# Patient Record
Sex: Male | Born: 1935 | Race: Black or African American | Hispanic: No | Marital: Single
Health system: Southern US, Community
[De-identification: ages and names within clinical notes are randomized; demographics above are authoritative.]

---

## 2013-11-10 DEATH — deceased

## 2017-05-27 ENCOUNTER — Inpatient Hospital Stay
Admission: AD | Admit: 2017-05-27 | Discharge: 2017-07-11 | Disposition: E | Payer: Self-pay | Source: Other Acute Inpatient Hospital | Attending: Internal Medicine | Admitting: Internal Medicine

## 2017-05-27 ENCOUNTER — Other Ambulatory Visit (HOSPITAL_COMMUNITY): Payer: Self-pay

## 2017-05-27 DIAGNOSIS — K922 Gastrointestinal hemorrhage, unspecified: Secondary | ICD-10-CM

## 2017-05-27 DIAGNOSIS — Z4659 Encounter for fitting and adjustment of other gastrointestinal appliance and device: Secondary | ICD-10-CM

## 2017-05-27 DIAGNOSIS — J9690 Respiratory failure, unspecified, unspecified whether with hypoxia or hypercapnia: Secondary | ICD-10-CM

## 2017-05-27 DIAGNOSIS — Z978 Presence of other specified devices: Secondary | ICD-10-CM

## 2017-05-27 DIAGNOSIS — K567 Ileus, unspecified: Secondary | ICD-10-CM

## 2017-05-27 DIAGNOSIS — J189 Pneumonia, unspecified organism: Secondary | ICD-10-CM

## 2017-05-28 LAB — CBC WITH DIFFERENTIAL/PLATELET
BASOS ABS: 0 10*3/uL (ref 0.0–0.1)
BASOS PCT: 0 %
EOS ABS: 0.1 10*3/uL (ref 0.0–0.7)
EOS PCT: 1 %
HCT: 33.3 % — ABNORMAL LOW (ref 39.0–52.0)
Hemoglobin: 9.6 g/dL — ABNORMAL LOW (ref 13.0–17.0)
Lymphocytes Relative: 9 %
Lymphs Abs: 0.7 10*3/uL (ref 0.7–4.0)
MCH: 30.1 pg (ref 26.0–34.0)
MCHC: 28.8 g/dL — AB (ref 30.0–36.0)
MCV: 104.4 fL — ABNORMAL HIGH (ref 78.0–100.0)
MONO ABS: 0.5 10*3/uL (ref 0.1–1.0)
Monocytes Relative: 6 %
Neutro Abs: 6.4 10*3/uL (ref 1.7–7.7)
Neutrophils Relative %: 84 %
PLATELETS: 126 10*3/uL — AB (ref 150–400)
RBC: 3.19 MIL/uL — ABNORMAL LOW (ref 4.22–5.81)
RDW: 18.5 % — AB (ref 11.5–15.5)
WBC: 7.7 10*3/uL (ref 4.0–10.5)

## 2017-05-28 LAB — CBC
HEMATOCRIT: 35.5 % — AB (ref 39.0–52.0)
HEMOGLOBIN: 10.3 g/dL — AB (ref 13.0–17.0)
MCH: 30.5 pg (ref 26.0–34.0)
MCHC: 29 g/dL — ABNORMAL LOW (ref 30.0–36.0)
MCV: 105 fL — ABNORMAL HIGH (ref 78.0–100.0)
Platelets: 137 10*3/uL — ABNORMAL LOW (ref 150–400)
RBC: 3.38 MIL/uL — ABNORMAL LOW (ref 4.22–5.81)
RDW: 18.8 % — ABNORMAL HIGH (ref 11.5–15.5)
WBC: 8.5 10*3/uL (ref 4.0–10.5)

## 2017-05-28 LAB — COMPREHENSIVE METABOLIC PANEL
ALBUMIN: 3.7 g/dL (ref 3.5–5.0)
ALT: 10 U/L — ABNORMAL LOW (ref 17–63)
ANION GAP: 11 (ref 5–15)
AST: 15 U/L (ref 15–41)
Alkaline Phosphatase: 142 U/L — ABNORMAL HIGH (ref 38–126)
BUN: 29 mg/dL — AB (ref 6–20)
CHLORIDE: 99 mmol/L — AB (ref 101–111)
CO2: 27 mmol/L (ref 22–32)
Calcium: 9.2 mg/dL (ref 8.9–10.3)
Creatinine, Ser: 4.28 mg/dL — ABNORMAL HIGH (ref 0.61–1.24)
GFR calc Af Amer: 14 mL/min — ABNORMAL LOW (ref >60)
GFR calc non Af Amer: 12 mL/min — ABNORMAL LOW (ref >60)
GLUCOSE: 106 mg/dL — AB (ref 65–99)
POTASSIUM: 3.9 mmol/L (ref 3.5–5.1)
Sodium: 137 mmol/L (ref 135–145)
Total Bilirubin: 0.8 mg/dL (ref 0.3–1.2)
Total Protein: 6.2 g/dL — ABNORMAL LOW (ref 6.5–8.1)

## 2017-05-28 LAB — RENAL FUNCTION PANEL
ALBUMIN: 3.9 g/dL (ref 3.5–5.0)
ANION GAP: 9 (ref 5–15)
BUN: 34 mg/dL — ABNORMAL HIGH (ref 6–20)
CO2: 26 mmol/L (ref 22–32)
Calcium: 9.3 mg/dL (ref 8.9–10.3)
Chloride: 97 mmol/L — ABNORMAL LOW (ref 101–111)
Creatinine, Ser: 4.78 mg/dL — ABNORMAL HIGH (ref 0.61–1.24)
GFR calc Af Amer: 12 mL/min — ABNORMAL LOW (ref >60)
GFR, EST NON AFRICAN AMERICAN: 11 mL/min — AB (ref >60)
Glucose, Bld: 211 mg/dL — ABNORMAL HIGH (ref 65–99)
PHOSPHORUS: 4.7 mg/dL — AB (ref 2.5–4.6)
POTASSIUM: 4 mmol/L (ref 3.5–5.1)
Sodium: 132 mmol/L — ABNORMAL LOW (ref 135–145)

## 2017-05-28 LAB — TSH: TSH: 12.29 u[IU]/mL — AB (ref 0.350–4.500)

## 2017-05-28 LAB — MAGNESIUM: MAGNESIUM: 2.4 mg/dL (ref 1.7–2.4)

## 2017-05-28 LAB — PROTIME-INR
INR: 3.46
PROTHROMBIN TIME: 35.6 s — AB (ref 11.4–15.2)

## 2017-05-28 LAB — PHOSPHORUS: PHOSPHORUS: 4.6 mg/dL (ref 2.5–4.6)

## 2017-05-28 NOTE — Consult Note (Signed)
[facility] CONSULT NOTE    Date: 05/28/2017                  Patient Name:  David Hatfield  MRN: [id number]  DOB: Feb 02, 1936  Age / Sex: 81 y.o., male         PCP: Patient, No Pcp Per                 Service Requesting Consult: hospitalist                 Reason for Consult: Management of end-stage renal disease            History of Present Illness: Patient is a 81 y.o. male with a PMHx of end-stage renal disease, atrial fibrillation, aortic stenosis status post St. Jude valve replacement on anticoagulation, hypertension, anemia of chronic kidney disease, secondary hyperparathyroidism, chronic hypoxic respiratory failure, coronary artery disease status post CABG, history of mitral valve replacement who [facility].  He was initially found to have a right internal jugular thrombus for which she was started on anticoagulation. He also had septic shock while at the outside hospital for possible aspiration pneumonia. He was transitioned here for ongoing care. He is a bit lethargic at the moment and cannot offer significant history as to why he is here. Apparently the patient has been on doses for approximately 5 years. Patient has history of hypertension which is made ultrafiltration difficult.   Medications: Coumadin Midodrine 10 mg daily, metoprolol 25 mg daily, Pepcid 20 mg twice a day, calcium acetate 2 tablets by mouth 3 times a day, Lipitor 40 mg daily  Allergies: penicillin and codeine   Past Medical History: end-stage renal disease, atrial fibrillation, aortic stenosis status post St. Jude valve replacement on anticoagulation, hypertension, anemia of chronic kidney disease, secondary hyperparathyroidism, chronic hypoxic respiratory failure, coronary artery disease status post CABG, history of mitral valve replacement who [facility]  generalized debility.    Past Surgical History: Aortic valve replacement CABG Mitral valve replacement Cataract removal  Family History: Includes coronary artery disease and hypertension  Social History: Patient apparently lives at home alone. He was independent of activities of daily living prior to admission. He quit tobacco use in 1997. Alcoholism drug use.   Review of Systems: Patient unable to focus on review of systems questions.  Vital Signs: Temperature 96.5 pulse 85 respirations 17 blood pressure 106/60 Weight trends: There were no vitals filed for this visit.  Physical Exam: General: Sitting up in chair, no acute distress  Head: Normocephalic, atraumatic.  Eyes: Anicteric, EOMI  Nose: Mucous membranes moist, not inflammed, nonerythematous.  Throat: Oropharynx nonerythematous, no exudate appreciated.   Neck: Supple, trachea midline.  Lungs:  Normal respiratory effort.  Scattered rhonchi  Heart: S1S2 irregular 2/6 SEM  Abdomen:  BS normoactive. Soft, Nondistended, non-tender.  No masses or organomegaly.  Extremities: Trace b/l LE edema  Neurologic: Awake, confused, will follow simple commands  Skin: No visible rashes, scars.    Lab results: Basic Metabolic Panel:  Recent Labs Lab 05/28/17 0819  NA 137  K 3.9  CL 99*  CO2 27  GLUCOSE 106*  BUN 29*  CREATININE 4.28*  CALCIUM 9.2  MG 2.4  PHOS 4.6    Liver Function Tests:  Recent Labs Lab 05/28/17 0819  AST 15  ALT 10*  ALKPHOS 142*  BILITOT 0.8  PROT 6.2*  ALBUMIN 3.7   No results for input(s): LIPASE, AMYLASE in the last 168 hours. No results for input(s): AMMONIA in the last 168 hours.  CBC:  Recent Labs Lab 05/28/17 0819  WBC 7.7  NEUTROABS 6.4  HGB 9.6*  HCT 33.3*  MCV 104.4*  PLT 126*    Cardiac Enzymes: No results for input(s): CKTOTAL, CKMB, CKMBINDEX, TROPONINI in the last 168 hours.  BNP: Invalid input(s): POCBNP  CBG: No results for input(s): GLUCAP in the last  168 hours.  Microbiology: No results found for this or any previous visit.  Coagulation Studies:  Recent Labs  05/28/17 0819  LABPROT 35.6*  INR 3.46    Urinalysis: No results for input(s): COLORURINE, LABSPEC, PHURINE, GLUCOSEU, HGBUR, BILIRUBINUR, KETONESUR, PROTEINUR, UROBILINOGEN, NITRITE, LEUKOCYTESUR in the last 72 hours.  Invalid input(s): APPERANCEUR    Imaging: Dg Chest Port 1 View  Result Date: 05/14/2017 CLINICAL DATA:  Respiratory failure.  Pneumonia. EXAM: PORTABLE CHEST 1 VIEW COMPARISON:  None. FINDINGS: Very low lung volumes limit assessment. Left-sided pacemaker with leads projecting over the right atrium and ventricle. Patient is post median sternotomy with prosthetic valve. The heart is enlarged. Ill-defined patchy opacity in the left mid lower lung zone. Elevated right hemidiaphragm versus lower lobe atelectasis/collapse. Bilateral pleural effusions. Peribronchial thickening in the aerated lungs. No evidence of acute osseous abnormality. IMPRESSION: Very low lung volumes. Cardiomegaly with bilateral pleural effusions and peribronchial thickening, suggesting pulmonary edema. Suspect an element of congestive failure. Patchy opacity in the left mid lower lung zone suspicious for pneumonia, however compressive atelectasis in the setting of pleural effusion could have a similar appearance. Elevated right hemidiaphragm versus lower lobe atelectasis/collapse. No prior exams for comparison. Electronically Signed   By: Rubye OaksMelanie  Ehinger M.D.   On: 06/05/2017 22:04      Assessment & Plan: Pt is a 81 y.o. male with a PMHx of end-stage renal disease, atrial fibrillation, aortic stenosis status post St. Jude valve replacement on anticoagulation, hypertension, anemia of chronic kidney disease, secondary hyperparathyroidism, chronic hypoxic respiratory failure, coronary artery disease status post CABG, history of mitral valve replacement who [facility] debility.    1. ESRD on HD. We will plan for dialysis starting tomorrow. Time will be 3.5 hours, blood flow rate 400, dialysis flow rate 800, ultrafiltration target 1.5 kg. We will need to monitor the patient closely as he said difficulties with ultrafiltration previously. We may need to consider albumin support as well.  2. Hypertension. Chronic in nature. Continue the patient on midodrine. Consider use of albumin as above.  3.  Anemia of chronic kidney disease. Hemoglobin currently 9.6. Continue to monitor during the course of hospitalization.  4. Secondary hyperparathyroidism. Continue calcium acetate for phosphorus control. Check PTH and phosphorus tomorrow.  5. Hyponatremia. Should partially correct with hemodialysis.

## 2017-05-29 LAB — CBC WITH DIFFERENTIAL/PLATELET
Basophils Absolute: 0 10*3/uL (ref 0.0–0.1)
Basophils Relative: 1 %
EOS ABS: 0.1 10*3/uL (ref 0.0–0.7)
Eosinophils Relative: 2 %
HEMATOCRIT: 32.5 % — AB (ref 39.0–52.0)
Hemoglobin: 9.6 g/dL — ABNORMAL LOW (ref 13.0–17.0)
LYMPHS ABS: 0.6 10*3/uL — AB (ref 0.7–4.0)
Lymphocytes Relative: 9 %
MCH: 30.4 pg (ref 26.0–34.0)
MCHC: 29.5 g/dL — AB (ref 30.0–36.0)
MCV: 102.8 fL — ABNORMAL HIGH (ref 78.0–100.0)
MONOS PCT: 8 %
Monocytes Absolute: 0.5 10*3/uL (ref 0.1–1.0)
NEUTROS ABS: 5.3 10*3/uL (ref 1.7–7.7)
NEUTROS PCT: 82 %
Platelets: 107 10*3/uL — ABNORMAL LOW (ref 150–400)
RBC: 3.16 MIL/uL — AB (ref 4.22–5.81)
RDW: 18.2 % — ABNORMAL HIGH (ref 11.5–15.5)
WBC: 6.6 10*3/uL (ref 4.0–10.5)

## 2017-05-29 LAB — RENAL FUNCTION PANEL
ALBUMIN: 3.5 g/dL (ref 3.5–5.0)
Albumin: 3.6 g/dL (ref 3.5–5.0)
Anion gap: 9 (ref 5–15)
Anion gap: 9 (ref 5–15)
BUN: 18 mg/dL (ref 6–20)
BUN: 19 mg/dL (ref 6–20)
CALCIUM: 8.8 mg/dL — AB (ref 8.9–10.3)
CALCIUM: 8.9 mg/dL (ref 8.9–10.3)
CHLORIDE: 99 mmol/L — AB (ref 101–111)
CO2: 29 mmol/L (ref 22–32)
CO2: 28 mmol/L (ref 22–32)
Chloride: 100 mmol/L — ABNORMAL LOW (ref 101–111)
Creatinine, Ser: 3.04 mg/dL — ABNORMAL HIGH (ref 0.61–1.24)
Creatinine, Ser: 3.02 mg/dL — ABNORMAL HIGH (ref 0.61–1.24)
GFR calc Af Amer: 21 mL/min — ABNORMAL LOW (ref >60)
GFR calc Af Amer: 21 mL/min — ABNORMAL LOW (ref >60)
GFR calc non Af Amer: 18 mL/min — ABNORMAL LOW (ref >60)
GFR, EST NON AFRICAN AMERICAN: 18 mL/min — AB (ref >60)
GLUCOSE: 101 mg/dL — AB (ref 65–99)
GLUCOSE: 104 mg/dL — AB (ref 65–99)
PHOSPHORUS: 2.3 mg/dL — AB (ref 2.5–4.6)
POTASSIUM: 3.4 mmol/L — AB (ref 3.5–5.1)
Phosphorus: 2.3 mg/dL — ABNORMAL LOW (ref 2.5–4.6)
Potassium: 3.4 mmol/L — ABNORMAL LOW (ref 3.5–5.1)
SODIUM: 136 mmol/L (ref 135–145)
SODIUM: 138 mmol/L (ref 135–145)

## 2017-05-29 LAB — MAGNESIUM: MAGNESIUM: 2 mg/dL (ref 1.7–2.4)

## 2017-05-29 LAB — PROTIME-INR
INR: 2.92
PROTHROMBIN TIME: 31.1 s — AB (ref 11.4–15.2)

## 2017-05-29 LAB — HEMOGLOBIN A1C
HEMOGLOBIN A1C: 5.1 % (ref 4.8–5.6)
Mean Plasma Glucose: 100 mg/dL

## 2017-05-30 LAB — PROTIME-INR
INR: 2.11
Prothrombin Time: 24 seconds — ABNORMAL HIGH (ref 11.4–15.2)

## 2017-05-30 LAB — CBC
HEMATOCRIT: 30.9 % — AB (ref 39.0–52.0)
Hemoglobin: 9.2 g/dL — ABNORMAL LOW (ref 13.0–17.0)
MCH: 30.8 pg (ref 26.0–34.0)
MCHC: 29.8 g/dL — AB (ref 30.0–36.0)
MCV: 103.3 fL — ABNORMAL HIGH (ref 78.0–100.0)
Platelets: 105 10*3/uL — ABNORMAL LOW (ref 150–400)
RBC: 2.99 MIL/uL — ABNORMAL LOW (ref 4.22–5.81)
RDW: 18.5 % — ABNORMAL HIGH (ref 11.5–15.5)
WBC: 5.4 10*3/uL (ref 4.0–10.5)

## 2017-05-30 LAB — RENAL FUNCTION PANEL
ALBUMIN: 3.3 g/dL — AB (ref 3.5–5.0)
ANION GAP: 11 (ref 5–15)
BUN: 32 mg/dL — ABNORMAL HIGH (ref 6–20)
CO2: 26 mmol/L (ref 22–32)
Calcium: 8.7 mg/dL — ABNORMAL LOW (ref 8.9–10.3)
Chloride: 98 mmol/L — ABNORMAL LOW (ref 101–111)
Creatinine, Ser: 4.06 mg/dL — ABNORMAL HIGH (ref 0.61–1.24)
GFR calc non Af Amer: 13 mL/min — ABNORMAL LOW (ref >60)
GFR, EST AFRICAN AMERICAN: 15 mL/min — AB (ref >60)
GLUCOSE: 85 mg/dL (ref 65–99)
PHOSPHORUS: 3 mg/dL (ref 2.5–4.6)
POTASSIUM: 3.8 mmol/L (ref 3.5–5.1)
Sodium: 135 mmol/L (ref 135–145)

## 2017-05-30 LAB — PTH, INTACT AND CALCIUM
CALCIUM TOTAL (PTH): 8.7 mg/dL (ref 8.6–10.2)
PTH: 33 pg/mL (ref 15–65)

## 2017-05-30 LAB — HEPATITIS B SURFACE ANTIBODY,QUALITATIVE
HEP B S AB: NONREACTIVE
Hep B S Ab: NONREACTIVE

## 2017-05-30 LAB — HEPATITIS B CORE ANTIBODY, TOTAL
HEP B C TOTAL AB: NEGATIVE
HEP B C TOTAL AB: NEGATIVE

## 2017-05-30 LAB — HEPATITIS B SURFACE ANTIGEN
Hepatitis B Surface Ag: NEGATIVE
Hepatitis B Surface Ag: NEGATIVE

## 2017-05-30 NOTE — Progress Notes (Signed)
[facility]  ROUNDING NOTE   Subjective:  Patient seen and evaluated at bedside. He did complete hemodialysis today. Ultrafiltration and she was only 575 cc due to low blood pressure.    Objective:  Vital signs in last 24 hours:  Temperature 96.2 pulse 72 respirations 20 blood pressure 99/59  Physical Exam: General: No acute distress  Head: Normocephalic, atraumatic. Moist oral mucosal membranes  Eyes: Anicteric  Neck: Supple, trachea midline  Lungs:  Clear to auscultation, normal effort  Heart: S1S2 paced rhythm  Abdomen:  Soft, nontender, bowel sounds present  Extremities: Trace peripheral edema.  Neurologic: Lethargic but arousable  Skin: No lesions  Access: RUE AVF    Basic Metabolic Panel:  Recent Labs Lab 05/28/17 0819 05/28/17 1930 05/29/17 0726 05/30/17 0523  NA 137 132* 136  138 135  K 3.9 4.0 3.4*  3.4* 3.8  CL 99* 97* 99*  100* 98*  CO2 27 26 28  29 26   GLUCOSE 106* 211* 101*  104* 85  BUN 29* 34* 19  18 32*  CREATININE 4.28* 4.78* 3.02*  3.04* 4.06*  CALCIUM 9.2 9.3 8.8*  8.9  8.7 8.7*  MG 2.4  --  2.0  --   PHOS 4.6 4.7* 2.3*  2.3* 3.0    Liver Function Tests:  Recent Labs Lab 05/28/17 0819 05/28/17 1930 05/29/17 0726 05/30/17 0523  AST 15  --   --   --   ALT 10*  --   --   --   ALKPHOS 142*  --   --   --   BILITOT 0.8  --   --   --   PROT 6.2*  --   --   --   ALBUMIN 3.7 3.9 3.6  3.5 3.3*   No results for input(s): LIPASE, AMYLASE in the last 168 hours. No results for input(s): AMMONIA in the last 168 hours.  CBC:  Recent Labs Lab 05/28/17 0819 05/28/17 1930 05/29/17 0726 05/30/17 0523  WBC 7.7 8.5 6.6 5.4  NEUTROABS 6.4  --  5.3  --   HGB 9.6* 10.3* 9.6* 9.2*  HCT 33.3* 35.5* 32.5* 30.9*  MCV 104.4* 105.0* 102.8* 103.3*  PLT 126* 137* 107* 105*    Cardiac Enzymes: No results for input(s): CKTOTAL, CKMB, CKMBINDEX, TROPONINI in the last 168 hours.  BNP: Invalid input(s): POCBNP  CBG: No  results for input(s): GLUCAP in the last 168 hours.  Microbiology: No results found for this or any previous visit.  Coagulation Studies:  Recent Labs  05/28/17 0819 05/29/17 0726 05/30/17 0523  LABPROT 35.6* 31.1* 24.0*  INR 3.46 2.92 2.11    Urinalysis: No results for input(s): COLORURINE, LABSPEC, PHURINE, GLUCOSEU, HGBUR, BILIRUBINUR, KETONESUR, PROTEINUR, UROBILINOGEN, NITRITE, LEUKOCYTESUR in the last 72 hours.  Invalid input(s): APPERANCEUR    Imaging: No results found.   Medications:       Assessment/ Plan:  81 y.o. male with a PMHx of end-stage renal disease, atrial fibrillation, aortic stenosis status post St. Jude valve replacement on anticoagulation, hypertension, anemia of chronic kidney disease, secondary hyperparathyroidism, chronic hypoxic respiratory failure, coronary artery disease status post CABG, history of mitral valve replacement who [facility].    1. ESRD on HD. Patient completed hemodialysis today. Ultrafiltration she was only 575 cc secondary to hypotension. We will use albumin with next ulcers treatment. He is also on midodrine.  2. Hypotension. Continue use of midodrine. We will also use albumin  with dialysis treatments.  3.  Anemia of chronic kidney disease. Hemoglobin relatively stable at 9.2. Continue to monitor.  4. Secondary hyperparathyroidism. Phosphorus 3.0. Continue calcium acetate.  PT is low at 33. Continue to monitor.  5. Hyponatremia. Serum sodium now up to 135. Continue to monitor.   LOS: 0 Lovelle Lema 6/20/20183:39 PM

## 2017-05-31 LAB — PROTIME-INR
INR: 1.67
Prothrombin Time: 19.9 seconds — ABNORMAL HIGH (ref 11.4–15.2)

## 2017-06-01 LAB — RENAL FUNCTION PANEL
ALBUMIN: 3.4 g/dL — AB (ref 3.5–5.0)
Anion gap: 9 (ref 5–15)
BUN: 34 mg/dL — AB (ref 6–20)
CALCIUM: 8.5 mg/dL — AB (ref 8.9–10.3)
CO2: 26 mmol/L (ref 22–32)
Chloride: 98 mmol/L — ABNORMAL LOW (ref 101–111)
Creatinine, Ser: 4.36 mg/dL — ABNORMAL HIGH (ref 0.61–1.24)
GFR calc Af Amer: 14 mL/min — ABNORMAL LOW (ref >60)
GFR calc non Af Amer: 12 mL/min — ABNORMAL LOW (ref >60)
GLUCOSE: 89 mg/dL (ref 65–99)
PHOSPHORUS: 3.2 mg/dL (ref 2.5–4.6)
Potassium: 4.4 mmol/L (ref 3.5–5.1)
SODIUM: 133 mmol/L — AB (ref 135–145)

## 2017-06-01 LAB — PROTIME-INR
INR: 1.26
Prothrombin Time: 15.9 seconds — ABNORMAL HIGH (ref 11.4–15.2)

## 2017-06-01 NOTE — Progress Notes (Signed)
[facility]  ROUNDING NOTE   Subjective:  Patient remains lethargic but he is arousable. He is aware of where he is but cannot answer many questions. He is due for hemodialysis today.  Objective:  Vital signs in last 24 hours:  Temperature 97.6 pulse 72 respirations 30 blood pressure 108/58  Physical Exam: General: No acute distress  Head: Normocephalic, atraumatic. Moist oral mucosal membranes  Eyes: Anicteric  Neck: Supple, trachea midline  Lungs:  Clear to auscultation, normal effort  Heart: S1S2 paced rhythm  Abdomen:  Soft, nontender, bowel sounds present  Extremities: Trace peripheral edema.  Neurologic: Lethargic but arousable  Skin: No lesions  Access: RUE AVF    Basic Metabolic Panel:  Recent Labs Lab 05/28/17 0819 05/28/17 1930 05/29/17 0726 05/30/17 0523  NA 137 132* 136  138 135  K 3.9 4.0 3.4*  3.4* 3.8  CL 99* 97* 99*  100* 98*  CO2 27 26 28  29 26   GLUCOSE 106* 211* 101*  104* 85  BUN 29* 34* 19  18 32*  CREATININE 4.28* 4.78* 3.02*  3.04* 4.06*  CALCIUM 9.2 9.3 8.8*  8.9  8.7 8.7*  MG 2.4  --  2.0  --   PHOS 4.6 4.7* 2.3*  2.3* 3.0    Liver Function Tests:  Recent Labs Lab 05/28/17 0819 05/28/17 1930 05/29/17 0726 05/30/17 0523  AST 15  --   --   --   ALT 10*  --   --   --   ALKPHOS 142*  --   --   --   BILITOT 0.8  --   --   --   PROT 6.2*  --   --   --   ALBUMIN 3.7 3.9 3.6  3.5 3.3*   No results for input(s): LIPASE, AMYLASE in the last 168 hours. No results for input(s): AMMONIA in the last 168 hours.  CBC:  Recent Labs Lab 05/28/17 0819 05/28/17 1930 05/29/17 0726 05/30/17 0523  WBC 7.7 8.5 6.6 5.4  NEUTROABS 6.4  --  5.3  --   HGB 9.6* 10.3* 9.6* 9.2*  HCT 33.3* 35.5* 32.5* 30.9*  MCV 104.4* 105.0* 102.8* 103.3*  PLT 126* 137* 107* 105*    Cardiac Enzymes: No results for input(s): CKTOTAL, CKMB, CKMBINDEX, TROPONINI in the last 168 hours.  BNP: Invalid input(s): POCBNP  CBG: No  results for input(s): GLUCAP in the last 168 hours.  Microbiology: No results found for this or any previous visit.  Coagulation Studies:  Recent Labs  05/30/17 0523 05/31/17 0547  LABPROT 24.0* 19.9*  INR 2.11 1.67    Urinalysis: No results for input(s): COLORURINE, LABSPEC, PHURINE, GLUCOSEU, HGBUR, BILIRUBINUR, KETONESUR, PROTEINUR, UROBILINOGEN, NITRITE, LEUKOCYTESUR in the last 72 hours.  Invalid input(s): APPERANCEUR    Imaging: No results found.   Medications:       Assessment/ Plan:  81 y.o. male with a PMHx of end-stage renal disease, atrial fibrillation, aortic stenosis status post St. Jude valve replacement on anticoagulation, hypertension, anemia of chronic kidney disease, secondary hyperparathyroidism, chronic hypoxic respiratory failure, coronary artery disease status post CABG, history of mitral valve replacement who [facility].    1. ESRD on HD. Patient due for hemodialysis today. Continue to use Migratine as well as albumin for blood pressure support. Ultrafiltration target 1.5 kg.  2. Hypotension. As above continue to use albumin and midodrine.  3.  Anemia of chronic kidney disease. Hemoglobin 9.2  at last check. Continue to monitor.  4. Secondary hyperparathyroidism. Last serum phosphorus was 3.0. Continue calcium acetate.  5. Hyponatremia. Most recent serum sodium had normalized. Continue to monitor.   LOS: 0 Gila Lauf 6/22/20188:24 AM

## 2017-06-02 LAB — AMMONIA: Ammonia: 52 umol/L — ABNORMAL HIGH (ref 9–35)

## 2017-06-02 LAB — COMPREHENSIVE METABOLIC PANEL
ALK PHOS: 186 U/L — AB (ref 38–126)
ALT: 17 U/L (ref 17–63)
AST: 36 U/L (ref 15–41)
Albumin: 3.7 g/dL (ref 3.5–5.0)
Anion gap: 10 (ref 5–15)
BUN: 32 mg/dL — AB (ref 6–20)
CALCIUM: 8.7 mg/dL — AB (ref 8.9–10.3)
CHLORIDE: 98 mmol/L — AB (ref 101–111)
CO2: 26 mmol/L (ref 22–32)
CREATININE: 3.63 mg/dL — AB (ref 0.61–1.24)
GFR calc Af Amer: 17 mL/min — ABNORMAL LOW (ref >60)
GFR calc non Af Amer: 15 mL/min — ABNORMAL LOW (ref >60)
Glucose, Bld: 102 mg/dL — ABNORMAL HIGH (ref 65–99)
Potassium: 4.5 mmol/L (ref 3.5–5.1)
SODIUM: 134 mmol/L — AB (ref 135–145)
Total Bilirubin: 1.1 mg/dL (ref 0.3–1.2)
Total Protein: 6.3 g/dL — ABNORMAL LOW (ref 6.5–8.1)

## 2017-06-03 LAB — HEPARIN LEVEL (UNFRACTIONATED): Heparin Unfractionated: <0.1 IU/mL — ABNORMAL LOW (ref 0.30–0.70)

## 2017-06-03 LAB — PROTIME-INR
INR: 1.17
Prothrombin Time: 15 seconds (ref 11.4–15.2)

## 2017-06-04 LAB — CBC
HCT: 32.4 % — ABNORMAL LOW (ref 39.0–52.0)
Hemoglobin: 9.5 g/dL — ABNORMAL LOW (ref 13.0–17.0)
MCH: 30.3 pg (ref 26.0–34.0)
MCHC: 29.3 g/dL — ABNORMAL LOW (ref 30.0–36.0)
MCV: 103.2 fL — AB (ref 78.0–100.0)
Platelets: 100 10*3/uL — ABNORMAL LOW (ref 150–400)
RBC: 3.14 MIL/uL — ABNORMAL LOW (ref 4.22–5.81)
RDW: 17.8 % — AB (ref 11.5–15.5)
WBC: 5.8 10*3/uL (ref 4.0–10.5)

## 2017-06-04 LAB — RENAL FUNCTION PANEL
Albumin: 3.5 g/dL (ref 3.5–5.0)
Anion gap: 10 (ref 5–15)
BUN: 60 mg/dL — AB (ref 6–20)
CHLORIDE: 97 mmol/L — AB (ref 101–111)
CO2: 25 mmol/L (ref 22–32)
CREATININE: 5.24 mg/dL — AB (ref 0.61–1.24)
Calcium: 8.8 mg/dL — ABNORMAL LOW (ref 8.9–10.3)
GFR calc Af Amer: 11 mL/min — ABNORMAL LOW (ref >60)
GFR, EST NON AFRICAN AMERICAN: 9 mL/min — AB (ref >60)
Glucose, Bld: 115 mg/dL — ABNORMAL HIGH (ref 65–99)
POTASSIUM: 5.2 mmol/L — AB (ref 3.5–5.1)
Phosphorus: 4.6 mg/dL (ref 2.5–4.6)
Sodium: 132 mmol/L — ABNORMAL LOW (ref 135–145)

## 2017-06-04 LAB — HEPARIN LEVEL (UNFRACTIONATED)
Heparin Unfractionated: <0.1 IU/mL — ABNORMAL LOW (ref 0.30–0.70)
Heparin Unfractionated: 0.54 IU/mL (ref 0.30–0.70)

## 2017-06-04 LAB — PROTIME-INR
INR: 1.32
PROTHROMBIN TIME: 16.5 s — AB (ref 11.4–15.2)

## 2017-06-04 NOTE — Progress Notes (Signed)
[facility]  ROUNDING NOTE   Subjective:  Patient  Seen during HD. toelrating fair BP is chronically low; menating ok   Objective:  Vital signs in last 24 hours:   pulse 75 respirations 26 blood pressure 85/45  Physical Exam: General: No acute distress  Head: Normocephalic, atraumatic. Moist oral mucosal membranes  Eyes: Anicteric  Neck: Supple, trachea midline  Lungs:  Clear to auscultation, normal effort, [place] O2  Heart: S1S2 paced rhythm  Abdomen:  Soft, nontender, bowel sounds present  Extremities: Trace peripheral edema.  Neurologic: Lethargic but arousable  Skin: No lesions  Access: RUE AVF    Basic Metabolic Panel:  Recent Labs Lab 05/28/17 1930 05/29/17 0726 05/30/17 0523 06/01/17 0804 06/02/17 1508 06/04/17 0459  NA 132* 136  138 135 133* 134* 132*  K 4.0 3.4*  3.4* 3.8 4.4 4.5 5.2*  CL 97* 99*  100* 98* 98* 98* 97*  CO2 26 28  29 26 26 26 25   GLUCOSE 211* 101*  104* 85 89 102* 115*  BUN 34* 19  18 32* 34* 32* 60*  CREATININE 4.78* 3.02*  3.04* 4.06* 4.36* 3.63* 5.24*  CALCIUM 9.3 8.8*  8.9  8.7 8.7* 8.5* 8.7* 8.8*  MG  --  2.0  --   --   --   --   PHOS 4.7* 2.3*  2.3* 3.0 3.2  --  4.6    Liver Function Tests:  Recent Labs Lab 05/29/17 0726 05/30/17 0523 06/01/17 0804 06/02/17 1508 06/04/17 0459  AST  --   --   --  36  --   ALT  --   --   --  17  --   ALKPHOS  --   --   --  186*  --   BILITOT  --   --   --  1.1  --   PROT  --   --   --  6.3*  --   ALBUMIN 3.6  3.5 3.3* 3.4* 3.7 3.5   No results for input(s): LIPASE, AMYLASE in the last 168 hours.  Recent Labs Lab 06/02/17 1508  AMMONIA 52*    CBC:  Recent Labs Lab 05/28/17 1930 05/29/17 0726 05/30/17 0523 06/04/17 0459  WBC 8.5 6.6 5.4 5.8  NEUTROABS  --  5.3  --   --   HGB 10.3* 9.6* 9.2* 9.5*  HCT 35.5* 32.5* 30.9* 32.4*  MCV 105.0* 102.8* 103.3* 103.2*  PLT 137* 107* 105* 100*    Cardiac Enzymes: No results for input(s): CKTOTAL, CKMB,  CKMBINDEX, TROPONINI in the last 168 hours.  BNP: Invalid input(s): POCBNP  CBG: No results for input(s): GLUCAP in the last 168 hours.  Microbiology: No results found for this or any previous visit.  Coagulation Studies:  Recent Labs  06/03/17 0721 06/04/17 1051  LABPROT 15.0 16.5*  INR 1.17 1.32    Urinalysis: No results for input(s): COLORURINE, LABSPEC, PHURINE, GLUCOSEU, HGBUR, BILIRUBINUR, KETONESUR, PROTEINUR, UROBILINOGEN, NITRITE, LEUKOCYTESUR in the last 72 hours.  Invalid input(s): APPERANCEUR    Imaging: No results found.   Medications:       Assessment/ Plan:  81 y.o. male with a PMHx of end-stage renal disease, atrial fibrillation, aortic stenosis status post St. Jude valve replacement on anticoagulation, hypertension, anemia of chronic kidney disease, secondary hyperparathyroidism, chronic hypoxic respiratory failure, coronary artery disease status post CABG, history of mitral valve replacement [facility].    1. ESRD on  HD. Continue to use Midodrine as well as albumin for blood pressure support. Ultrafiltration as tolerated Next HD on wednesday  2. Hypotension. As above continue to use albumin and midodrine.  3.  Anemia of chronic kidney disease. Hemoglobin 9.5 at last check. Continue to monitor.  4. Secondary hyperparathyroidism. Last serum phosphorus was 4.6. Continue calcium acetate.     LOS: 0 Chenise Mulvihill 6/25/20182:52 PM

## 2017-06-05 LAB — CBC
HEMATOCRIT: 33.4 % — AB (ref 39.0–52.0)
Hemoglobin: 9.7 g/dL — ABNORMAL LOW (ref 13.0–17.0)
MCH: 29.8 pg (ref 26.0–34.0)
MCHC: 29 g/dL — AB (ref 30.0–36.0)
MCV: 102.8 fL — ABNORMAL HIGH (ref 78.0–100.0)
Platelets: 100 10*3/uL — ABNORMAL LOW (ref 150–400)
RBC: 3.25 MIL/uL — ABNORMAL LOW (ref 4.22–5.81)
RDW: 17.4 % — ABNORMAL HIGH (ref 11.5–15.5)
WBC: 6.1 10*3/uL (ref 4.0–10.5)

## 2017-06-05 LAB — RENAL FUNCTION PANEL
ALBUMIN: 3.7 g/dL (ref 3.5–5.0)
ANION GAP: 9 (ref 5–15)
BUN: 29 mg/dL — AB (ref 6–20)
CO2: 27 mmol/L (ref 22–32)
Calcium: 8.7 mg/dL — ABNORMAL LOW (ref 8.9–10.3)
Chloride: 97 mmol/L — ABNORMAL LOW (ref 101–111)
Creatinine, Ser: 3.44 mg/dL — ABNORMAL HIGH (ref 0.61–1.24)
GFR calc Af Amer: 18 mL/min — ABNORMAL LOW (ref >60)
GFR calc non Af Amer: 16 mL/min — ABNORMAL LOW (ref >60)
Glucose, Bld: 124 mg/dL — ABNORMAL HIGH (ref 65–99)
PHOSPHORUS: 3.3 mg/dL (ref 2.5–4.6)
POTASSIUM: 4.3 mmol/L (ref 3.5–5.1)
Sodium: 133 mmol/L — ABNORMAL LOW (ref 135–145)

## 2017-06-05 LAB — MAGNESIUM: Magnesium: 2.3 mg/dL (ref 1.7–2.4)

## 2017-06-05 LAB — HEPARIN LEVEL (UNFRACTIONATED)
HEPARIN UNFRACTIONATED: 0.95 [IU]/mL — AB (ref 0.30–0.70)
Heparin Unfractionated: 1.16 IU/mL — ABNORMAL HIGH (ref 0.30–0.70)

## 2017-06-05 LAB — PROTIME-INR
INR: 1.37
Prothrombin Time: 17 seconds — ABNORMAL HIGH (ref 11.4–15.2)

## 2017-06-06 LAB — RENAL FUNCTION PANEL
ANION GAP: 8 (ref 5–15)
Albumin: 3.6 g/dL (ref 3.5–5.0)
BUN: 51 mg/dL — ABNORMAL HIGH (ref 6–20)
CALCIUM: 9.2 mg/dL (ref 8.9–10.3)
CO2: 28 mmol/L (ref 22–32)
CREATININE: 4.52 mg/dL — AB (ref 0.61–1.24)
Chloride: 96 mmol/L — ABNORMAL LOW (ref 101–111)
GFR, EST AFRICAN AMERICAN: 13 mL/min — AB (ref >60)
GFR, EST NON AFRICAN AMERICAN: 11 mL/min — AB (ref >60)
Glucose, Bld: 116 mg/dL — ABNORMAL HIGH (ref 65–99)
PHOSPHORUS: 4.6 mg/dL (ref 2.5–4.6)
Potassium: 5.4 mmol/L — ABNORMAL HIGH (ref 3.5–5.1)
SODIUM: 132 mmol/L — AB (ref 135–145)

## 2017-06-06 LAB — BASIC METABOLIC PANEL
ANION GAP: 9 (ref 5–15)
BUN: 51 mg/dL — AB (ref 6–20)
CHLORIDE: 96 mmol/L — AB (ref 101–111)
CO2: 27 mmol/L (ref 22–32)
Calcium: 9.2 mg/dL (ref 8.9–10.3)
Creatinine, Ser: 4.56 mg/dL — ABNORMAL HIGH (ref 0.61–1.24)
GFR calc non Af Amer: 11 mL/min — ABNORMAL LOW (ref >60)
GFR, EST AFRICAN AMERICAN: 13 mL/min — AB (ref >60)
Glucose, Bld: 117 mg/dL — ABNORMAL HIGH (ref 65–99)
POTASSIUM: 5.4 mmol/L — AB (ref 3.5–5.1)
SODIUM: 132 mmol/L — AB (ref 135–145)

## 2017-06-06 LAB — HEPARIN LEVEL (UNFRACTIONATED)
HEPARIN UNFRACTIONATED: 0.46 [IU]/mL (ref 0.30–0.70)
Heparin Unfractionated: 0.8 IU/mL — ABNORMAL HIGH (ref 0.30–0.70)
Heparin Unfractionated: 0.68 IU/mL (ref 0.30–0.70)

## 2017-06-06 LAB — CBC
HEMATOCRIT: 33.1 % — AB (ref 39.0–52.0)
HEMOGLOBIN: 9.7 g/dL — AB (ref 13.0–17.0)
MCH: 30.2 pg (ref 26.0–34.0)
MCHC: 29.3 g/dL — ABNORMAL LOW (ref 30.0–36.0)
MCV: 103.1 fL — AB (ref 78.0–100.0)
Platelets: 121 10*3/uL — ABNORMAL LOW (ref 150–400)
RBC: 3.21 MIL/uL — AB (ref 4.22–5.81)
RDW: 17.4 % — ABNORMAL HIGH (ref 11.5–15.5)
WBC: 6.7 10*3/uL (ref 4.0–10.5)

## 2017-06-06 LAB — PROTIME-INR
INR: 1.83
PROTHROMBIN TIME: 21.4 s — AB (ref 11.4–15.2)

## 2017-06-06 NOTE — Progress Notes (Signed)
[facility]  ROUNDING NOTE   Subjective:  Patient scheduled for HD later today Family members at bedside BP is chronically low; menating ok   Objective:  Vital signs in last 24 hours:  T  96.7  P 62  R 16  BP 100/63  Physical Exam: General: No acute distress  Head: Normocephalic, atraumatic. Moist oral mucosal membranes  Eyes: Anicteric  Neck: Supple, trachea midline  Lungs:  Clear to auscultation, normal effort, [place] O2  Heart: S1S2 paced rhythm  Abdomen:  Soft, nontender, bowel sounds present  Extremities: Trace peripheral edema.  Neurologic: Able to answer questions  Skin: No lesions  Access: RUE AVF    Basic Metabolic Panel:  Recent Labs Lab 06/01/17 0804 06/02/17 1508 06/04/17 0459 06/05/17 0035 06/06/17 1005  NA 133* 134* 132* 133* 132*  132*  K 4.4 4.5 5.2* 4.3 5.4*  5.4*  CL 98* 98* 97* 97* 96*  96*  CO2 26 26 25 27 27  28   GLUCOSE 89 102* 115* 124* 117*  116*  BUN 34* 32* 60* 29* 51*  51*  CREATININE 4.36* 3.63* 5.24* 3.44* 4.56*  4.52*  CALCIUM 8.5* 8.7* 8.8* 8.7* 9.2  9.2  MG  --   --   --  2.3  --   PHOS 3.2  --  4.6 3.3 4.6    Liver Function Tests:  Recent Labs Lab 06/01/17 0804 06/02/17 1508 06/04/17 0459 06/05/17 0035 06/06/17 1005  AST  --  36  --   --   --   ALT  --  17  --   --   --   ALKPHOS  --  186*  --   --   --   BILITOT  --  1.1  --   --   --   PROT  --  6.3*  --   --   --   ALBUMIN 3.4* 3.7 3.5 3.7 3.6   No results for input(s): LIPASE, AMYLASE in the last 168 hours.  Recent Labs Lab 06/02/17 1508  AMMONIA 52*    CBC:  Recent Labs Lab 06/04/17 0459 06/05/17 0035 06/06/17 1005  WBC 5.8 6.1 6.7  HGB 9.5* 9.7* 9.7*  HCT 32.4* 33.4* 33.1*  MCV 103.2* 102.8* 103.1*  PLT 100* 100* 121*    Cardiac Enzymes: No results for input(s): CKTOTAL, CKMB, CKMBINDEX, TROPONINI in the last 168 hours.  BNP: Invalid input(s): POCBNP  CBG: No results for input(s): GLUCAP in the last 168  hours.  Microbiology: No results found for this or any previous visit.  Coagulation Studies:  Recent Labs  06/04/17 1051 06/05/17 0035 06/06/17 1005  LABPROT 16.5* 17.0* 21.4*  INR 1.32 1.37 1.83    Urinalysis: No results for input(s): COLORURINE, LABSPEC, PHURINE, GLUCOSEU, HGBUR, BILIRUBINUR, KETONESUR, PROTEINUR, UROBILINOGEN, NITRITE, LEUKOCYTESUR in the last 72 hours.  Invalid input(s): APPERANCEUR    Imaging: No results found.   Medications:       Assessment/ Plan:  81 y.o. male with a PMHx of end-stage renal disease, atrial fibrillation, aortic stenosis status post St. Jude valve replacement on anticoagulation, hypertension, anemia of chronic kidney disease, secondary hyperparathyroidism, chronic hypoxic respiratory failure, coronary artery disease status post CABG, history of mitral valve replacement [facility].    1. ESRD on HD. Continue to use Midodrine as well as albumin for blood pressure support. Ultrafiltration as tolerated Next HD on Friday  2. Hypotension. As above continue to  use albumin and midodrine.  3.  Anemia of chronic kidney disease. Hemoglobin 9.7 at last check. Continue to monitor.  4. Secondary hyperparathyroidism. Last serum phosphorus was 4.6. Continue calcium acetate.     LOS: 0 Sieanna Vanstone 6/27/20185:11 PM

## 2017-06-07 LAB — HEPARIN LEVEL (UNFRACTIONATED): Heparin Unfractionated: 1.04 IU/mL — ABNORMAL HIGH (ref 0.30–0.70)

## 2017-06-07 LAB — PROTIME-INR
INR: 2.34
PROTHROMBIN TIME: 26 s — AB (ref 11.4–15.2)

## 2017-06-08 LAB — RENAL FUNCTION PANEL
ALBUMIN: 3.7 g/dL (ref 3.5–5.0)
ANION GAP: 11 (ref 5–15)
BUN: 44 mg/dL — ABNORMAL HIGH (ref 6–20)
CALCIUM: 8.8 mg/dL — AB (ref 8.9–10.3)
CO2: 26 mmol/L (ref 22–32)
CREATININE: 4.14 mg/dL — AB (ref 0.61–1.24)
Chloride: 95 mmol/L — ABNORMAL LOW (ref 101–111)
GFR, EST AFRICAN AMERICAN: 15 mL/min — AB (ref >60)
GFR, EST NON AFRICAN AMERICAN: 13 mL/min — AB (ref >60)
Glucose, Bld: 109 mg/dL — ABNORMAL HIGH (ref 65–99)
PHOSPHORUS: 4.3 mg/dL (ref 2.5–4.6)
Potassium: 5.3 mmol/L — ABNORMAL HIGH (ref 3.5–5.1)
SODIUM: 132 mmol/L — AB (ref 135–145)

## 2017-06-08 LAB — CBC
HEMATOCRIT: 28.7 % — AB (ref 39.0–52.0)
Hemoglobin: 8.5 g/dL — ABNORMAL LOW (ref 13.0–17.0)
MCH: 30.2 pg (ref 26.0–34.0)
MCHC: 29.6 g/dL — ABNORMAL LOW (ref 30.0–36.0)
MCV: 102.1 fL — AB (ref 78.0–100.0)
PLATELETS: 129 10*3/uL — AB (ref 150–400)
RBC: 2.81 MIL/uL — ABNORMAL LOW (ref 4.22–5.81)
RDW: 17.6 % — ABNORMAL HIGH (ref 11.5–15.5)
WBC: 12.3 10*3/uL — ABNORMAL HIGH (ref 4.0–10.5)

## 2017-06-08 LAB — PROTIME-INR
INR: 2.69
Prothrombin Time: 29.2 seconds — ABNORMAL HIGH (ref 11.4–15.2)

## 2017-06-08 NOTE — Progress Notes (Signed)
[facility]  ROUNDING NOTE   Subjective:  Patient seen during dialysis. Sitting up in chair for dialysis   BP is chronically low;  Able to answer questions and follow commands   Objective:  Vital signs in last 24 hours:  T  97.6  P 78  R 35  BP 81/28  Physical Exam: General: No acute distress  Head: Normocephalic, atraumatic. Moist oral mucosal membranes  Eyes: Anicteric  Neck: Supple,   Lungs:  Clear to auscultation, normal effort, [place] O2  Heart: S1S2 paced rhythm  Abdomen:  Soft, nontender, bowel sounds present  Extremities: Trace peripheral edema.  Neurologic: Able to answer questions  Skin: No lesions  Access: RUE AVF    Basic Metabolic Panel:  Recent Labs Lab 06/02/17 1508 06/04/17 0459 06/05/17 0035 06/06/17 1005 06/08/17 0533  NA 134* 132* 133* 132*  132* 132*  K 4.5 5.2* 4.3 5.4*  5.4* 5.3*  CL 98* 97* 97* 96*  96* 95*  CO2 26 25 27 27  28 26   GLUCOSE 102* 115* 124* 117*  116* 109*  BUN 32* 60* 29* 51*  51* 44*  CREATININE 3.63* 5.24* 3.44* 4.56*  4.52* 4.14*  CALCIUM 8.7* 8.8* 8.7* 9.2  9.2 8.8*  MG  --   --  2.3  --   --   PHOS  --  4.6 3.3 4.6 4.3    Liver Function Tests:  Recent Labs Lab 06/02/17 1508 06/04/17 0459 06/05/17 0035 06/06/17 1005 06/08/17 0533  AST 36  --   --   --   --   ALT 17  --   --   --   --   ALKPHOS 186*  --   --   --   --   BILITOT 1.1  --   --   --   --   PROT 6.3*  --   --   --   --   ALBUMIN 3.7 3.5 3.7 3.6 3.7   No results for input(s): LIPASE, AMYLASE in the last 168 hours.  Recent Labs Lab 06/02/17 1508  AMMONIA 52*    CBC:  Recent Labs Lab 06/04/17 0459 06/05/17 0035 06/06/17 1005 06/08/17 0533  WBC 5.8 6.1 6.7 12.3*  HGB 9.5* 9.7* 9.7* 8.5*  HCT 32.4* 33.4* 33.1* 28.7*  MCV 103.2* 102.8* 103.1* 102.1*  PLT 100* 100* 121* PENDING    Cardiac Enzymes: No results for input(s): CKTOTAL, CKMB, CKMBINDEX, TROPONINI in the last 168 hours.  BNP: Invalid input(s):  POCBNP  CBG: No results for input(s): GLUCAP in the last 168 hours.  Microbiology: No results found for this or any previous visit.  Coagulation Studies:  Recent Labs  06/06/17 1005 06/07/17 0612 06/08/17 0533  LABPROT 21.4* 26.0* 29.2*  INR 1.83 2.34 2.69    Urinalysis: No results for input(s): COLORURINE, LABSPEC, PHURINE, GLUCOSEU, HGBUR, BILIRUBINUR, KETONESUR, PROTEINUR, UROBILINOGEN, NITRITE, LEUKOCYTESUR in the last 72 hours.  Invalid input(s): APPERANCEUR    Imaging: No results found.   Medications:       Assessment/ Plan:  81 y.o. male with a PMHx of end-stage renal disease, atrial fibrillation, aortic stenosis status post St. Jude valve replacement on anticoagulation, hypertension, anemia of chronic kidney disease, secondary hyperparathyroidism, chronic hypoxic respiratory failure, coronary artery disease status post CABG, history of mitral valve replacement [facility].    1. ESRD on HD. Continue to use Midodrine as well as albumin for blood pressure support.  Ultrafiltration as tolerated Patient seen during HD. Tolerating well.   2. Hypotension. As above continue to use albumin and midodrine.  3.  Anemia of chronic kidney disease. Hemoglobin 8.5 at last check. Continue to monitor.  4. Secondary hyperparathyroidism. Last serum phosphorus was 4.3. Continue calcium acetate.     LOS: 0 Erby Sanderson 6/29/20189:14 AM

## 2017-06-09 LAB — PROTIME-INR
INR: 3.49
PROTHROMBIN TIME: 35.9 s — AB (ref 11.4–15.2)

## 2017-06-10 LAB — PROTIME-INR
INR: 3.6
PROTHROMBIN TIME: 36.8 s — AB (ref 11.4–15.2)

## 2017-06-10 DEATH — deceased

## 2017-06-11 ENCOUNTER — Other Ambulatory Visit (HOSPITAL_COMMUNITY): Payer: Self-pay

## 2017-06-11 LAB — TROPONIN I
TROPONIN I: 0.47 ng/mL — AB (ref <0.03)
Troponin I: 0.07 ng/mL (ref <0.03)

## 2017-06-11 LAB — CBC
HEMATOCRIT: 33 % — AB (ref 39.0–52.0)
HEMOGLOBIN: 9.4 g/dL — AB (ref 13.0–17.0)
MCH: 29.3 pg (ref 26.0–34.0)
MCHC: 28.5 g/dL — AB (ref 30.0–36.0)
MCV: 102.8 fL — ABNORMAL HIGH (ref 78.0–100.0)
Platelets: 130 10*3/uL — ABNORMAL LOW (ref 150–400)
RBC: 3.21 MIL/uL — ABNORMAL LOW (ref 4.22–5.81)
RDW: 17.2 % — ABNORMAL HIGH (ref 11.5–15.5)
WBC: 11.3 10*3/uL — AB (ref 4.0–10.5)

## 2017-06-11 LAB — BLOOD GAS, ARTERIAL
Acid-base deficit: 3.5 mmol/L — ABNORMAL HIGH (ref 0.0–2.0)
BICARBONATE: 23.2 mmol/L (ref 20.0–28.0)
FIO2: 100
LHR: 15 {breaths}/min
MECHVT: 500 mL
O2 Saturation: 96.8 %
PATIENT TEMPERATURE: 98.6
PCO2 ART: 59.6 mmHg — AB (ref 32.0–48.0)
PEEP: 5 cmH2O
PO2 ART: 101 mmHg (ref 83.0–108.0)
pH, Arterial: 7.214 — ABNORMAL LOW (ref 7.350–7.450)

## 2017-06-11 LAB — PROTIME-INR
INR: 2.98
Prothrombin Time: 31.6 seconds — ABNORMAL HIGH (ref 11.4–15.2)

## 2017-06-11 LAB — COMPREHENSIVE METABOLIC PANEL
ALBUMIN: 3.5 g/dL (ref 3.5–5.0)
ALK PHOS: 182 U/L — AB (ref 38–126)
ALT: 18 U/L (ref 17–63)
ANION GAP: 13 (ref 5–15)
AST: 26 U/L (ref 15–41)
BUN: 55 mg/dL — AB (ref 6–20)
CALCIUM: 9.5 mg/dL (ref 8.9–10.3)
CO2: 24 mmol/L (ref 22–32)
Chloride: 98 mmol/L — ABNORMAL LOW (ref 101–111)
Creatinine, Ser: 5.57 mg/dL — ABNORMAL HIGH (ref 0.61–1.24)
GFR calc Af Amer: 10 mL/min — ABNORMAL LOW (ref >60)
GFR calc non Af Amer: 9 mL/min — ABNORMAL LOW (ref >60)
GLUCOSE: 219 mg/dL — AB (ref 65–99)
POTASSIUM: 4.4 mmol/L (ref 3.5–5.1)
SODIUM: 135 mmol/L (ref 135–145)
Total Bilirubin: 0.6 mg/dL (ref 0.3–1.2)
Total Protein: 6.2 g/dL — ABNORMAL LOW (ref 6.5–8.1)

## 2017-06-11 LAB — AMMONIA: AMMONIA: 91 umol/L — AB (ref 9–35)

## 2017-06-11 LAB — MAGNESIUM: Magnesium: 2.5 mg/dL — ABNORMAL HIGH (ref 1.7–2.4)

## 2017-06-11 LAB — APTT: aPTT: 55 seconds — ABNORMAL HIGH (ref 24–36)

## 2017-06-11 MED FILL — Medication: Qty: 1 | Status: AC

## 2017-06-11 NOTE — Progress Notes (Signed)
[facility]  ROUNDING NOTE   Subjective:  Patient is doing poorly Had cardiac arrest this morning. Had to be placed back on ventilator Now appears to have anasarca  Vent: fio2 85%      Objective:  Vital signs in last 24 hours:  T  98.2  P 61 paced; R 26  BP 177/85  Physical Exam: General: Critically ill  Head: ETT in place  Eyes: Anicteric  Neck: Supple,   Lungs:  Vent assisted  Heart: S1S2 paced rhythm  Abdomen:  Soft,distended  Extremities: +++ peripheral edema.  Neurologic: sedated  Skin: Warm dry  Access: RUE AVF    Basic Metabolic Panel:  Recent Labs Lab 06/05/17 0035 06/06/17 1005 06/08/17 0533 06/11/17 0956  NA 133* 132*  132* 132* 135  K 4.3 5.4*  5.4* 5.3* 4.4  CL 97* 96*  96* 95* 98*  CO2 27 27  28 26 24   GLUCOSE 124* 117*  116* 109* 219*  BUN 29* 51*  51* 44* 55*  CREATININE 3.44* 4.56*  4.52* 4.14* 5.57*  CALCIUM 8.7* 9.2  9.2 8.8* 9.5  MG 2.3  --   --  2.5*  PHOS 3.3 4.6 4.3  --     Liver Function Tests:  Recent Labs Lab 06/05/17 0035 06/06/17 1005 06/08/17 0533 06/11/17 0956  AST  --   --   --  26  ALT  --   --   --  18  ALKPHOS  --   --   --  182*  BILITOT  --   --   --  0.6  PROT  --   --   --  6.2*  ALBUMIN 3.7 3.6 3.7 3.5   No results for input(s): LIPASE, AMYLASE in the last 168 hours.  Recent Labs Lab 06/11/17 0956  AMMONIA 91*    CBC:  Recent Labs Lab 06/05/17 0035 06/06/17 1005 06/08/17 0533 06/11/17 0956  WBC 6.1 6.7 12.3* 11.3*  HGB 9.7* 9.7* 8.5* 9.4*  HCT 33.4* 33.1* 28.7* 33.0*  MCV 102.8* 103.1* 102.1* 102.8*  PLT 100* 121* 129* 130*    Cardiac Enzymes:  Recent Labs Lab 06/11/17 0956  TROPONINI 0.07*    BNP: Invalid input(s): POCBNP  CBG: No results for input(s): GLUCAP in the last 168 hours.  Microbiology: No results found for this or any previous visit.  Coagulation Studies:  Recent Labs  06/09/17 0520 06/10/17 0558 06/11/17 0956  LABPROT 35.9* 36.8* 31.6*   INR 3.49 3.60 2.98    Urinalysis: No results for input(s): COLORURINE, LABSPEC, PHURINE, GLUCOSEU, HGBUR, BILIRUBINUR, KETONESUR, PROTEINUR, UROBILINOGEN, NITRITE, LEUKOCYTESUR in the last 72 hours.  Invalid input(s): APPERANCEUR    Imaging: Dg Chest Port 1 View  Result Date: 06/11/2017 CLINICAL DATA:  Endotracheal tube placement. EXAM: PORTABLE CHEST 1 VIEW COMPARISON:  06/04/2017. FINDINGS: 0955 hours. Tip of the endotracheal tube is in the mid trachea, approximately 3.8 cm above the carina. Left subclavian AICD leads appear unchanged. There is stable cardiomegaly and aortic atherosclerosis post CABG and valve replacement. There are persistent diffuse bilateral pulmonary opacities most consistent with edema. There is persistent elevation of the right hemidiaphragm with associated bibasilar atelectasis and possible small pleural effusions. No pneumothorax or consolidation. IMPRESSION: 1. Endotracheal tube is well positioned. 2. Similar appearance of the lungs with diffuse pulmonary opacities bilaterally suspicious for edema. Probable bibasilar atelectasis and small pleural effusions. Electronically Signed   By: Carey Bullocks M.D.   On: 06/11/2017 10:18     Medications:  Assessment/ Plan:  81 y.o. male with a PMHx of end-stage renal disease, atrial fibrillation, aortic stenosis status post St. Jude valve replacement on anticoagulation, hypertension, anemia of chronic kidney disease, secondary hyperparathyroidism, chronic hypoxic respiratory failure, coronary artery disease status post CABG, history of mitral valve replacement [facility].    1. ESRD on HD. With Anasarca Patient to have HD later today. Ultrafiltration as tolerated UF target increased to 2-3 kg  2. Hypotension.  Continue to use Midodrine as well as albumin for blood pressure support. BP higher this afternoon  3.  Anemia of chronic kidney  disease.  Hemoglobin 9.4 at last check. Continue to monitor.  4. Secondary hyperparathyroidism.  Last serum phosphorus was 4.3. Continue calcium acetate.  5. Abdominal distention - KUB - Bladder scan       LOS: 0 Anarosa Kubisiak 7/2/20183:59 PM

## 2017-06-12 ENCOUNTER — Other Ambulatory Visit (HOSPITAL_COMMUNITY): Payer: Self-pay

## 2017-06-12 ENCOUNTER — Other Ambulatory Visit (HOSPITAL_BASED_OUTPATIENT_CLINIC_OR_DEPARTMENT_OTHER): Payer: Self-pay

## 2017-06-12 DIAGNOSIS — I468 Cardiac arrest due to other underlying condition: Secondary | ICD-10-CM

## 2017-06-12 LAB — PROTIME-INR
INR: 2.48
PROTHROMBIN TIME: 27.3 s — AB (ref 11.4–15.2)

## 2017-06-12 LAB — TROPONIN I: Troponin I: 0.49 ng/mL (ref <0.03)

## 2017-06-12 NOTE — Progress Notes (Signed)
  Echocardiogram 2D Echocardiogram has been performed. Technically difficult exam due to patient respiratory status. Patient was unable to inform tech of cardiac history.   Deona Novitski L Androw 06/12/2017, 10:15 AM

## 2017-06-13 ENCOUNTER — Other Ambulatory Visit (HOSPITAL_COMMUNITY): Payer: Self-pay

## 2017-06-13 LAB — RENAL FUNCTION PANEL
Albumin: 3 g/dL — ABNORMAL LOW (ref 3.5–5.0)
Anion gap: 9 (ref 5–15)
BUN: 55 mg/dL — AB (ref 6–20)
CHLORIDE: 101 mmol/L (ref 101–111)
CO2: 25 mmol/L (ref 22–32)
Calcium: 8.7 mg/dL — ABNORMAL LOW (ref 8.9–10.3)
Creatinine, Ser: 5.12 mg/dL — ABNORMAL HIGH (ref 0.61–1.24)
GFR calc Af Amer: 11 mL/min — ABNORMAL LOW (ref >60)
GFR calc non Af Amer: 10 mL/min — ABNORMAL LOW (ref >60)
GLUCOSE: 102 mg/dL — AB (ref 65–99)
Phosphorus: 1.4 mg/dL — ABNORMAL LOW (ref 2.5–4.6)
Potassium: 4.1 mmol/L (ref 3.5–5.1)
Sodium: 135 mmol/L (ref 135–145)

## 2017-06-13 LAB — CBC
HEMATOCRIT: 24.5 % — AB (ref 39.0–52.0)
HEMATOCRIT: 26.1 % — AB (ref 39.0–52.0)
HEMOGLOBIN: 7.4 g/dL — AB (ref 13.0–17.0)
HEMOGLOBIN: 7.9 g/dL — AB (ref 13.0–17.0)
MCH: 30.2 pg (ref 26.0–34.0)
MCH: 30 pg (ref 26.0–34.0)
MCHC: 30.2 g/dL (ref 30.0–36.0)
MCHC: 30.3 g/dL (ref 30.0–36.0)
MCV: 100 fL (ref 78.0–100.0)
MCV: 99.2 fL (ref 78.0–100.0)
Platelets: 83 10*3/uL — ABNORMAL LOW (ref 150–400)
Platelets: 81 10*3/uL — ABNORMAL LOW (ref 150–400)
RBC: 2.45 MIL/uL — ABNORMAL LOW (ref 4.22–5.81)
RBC: 2.63 MIL/uL — ABNORMAL LOW (ref 4.22–5.81)
RDW: 17.4 % — ABNORMAL HIGH (ref 11.5–15.5)
RDW: 17.3 % — ABNORMAL HIGH (ref 11.5–15.5)
WBC: 7.9 10*3/uL (ref 4.0–10.5)
WBC: 8.3 10*3/uL (ref 4.0–10.5)

## 2017-06-13 LAB — BASIC METABOLIC PANEL
ANION GAP: 10 (ref 5–15)
BUN: 53 mg/dL — AB (ref 6–20)
CALCIUM: 8.7 mg/dL — AB (ref 8.9–10.3)
CO2: 26 mmol/L (ref 22–32)
CREATININE: 5.17 mg/dL — AB (ref 0.61–1.24)
Chloride: 100 mmol/L — ABNORMAL LOW (ref 101–111)
GFR calc Af Amer: 11 mL/min — ABNORMAL LOW (ref >60)
GFR calc non Af Amer: 10 mL/min — ABNORMAL LOW (ref >60)
GLUCOSE: 100 mg/dL — AB (ref 65–99)
Potassium: 4.1 mmol/L (ref 3.5–5.1)
Sodium: 136 mmol/L (ref 135–145)

## 2017-06-13 LAB — PROTIME-INR
INR: 2.46
Prothrombin Time: 27.1 seconds — ABNORMAL HIGH (ref 11.4–15.2)

## 2017-06-13 NOTE — Progress Notes (Signed)
[facility]  ROUNDING NOTE   Subjective:  Patient  Seen during HD Received versed this morning which dropped his BP. This caused delay in starting HD Patient seen during HD. Tolerating fair.      Objective:  Vital signs in last 24 hours:  T  99.4  P 59 paced; R 18  BP 84/38  Physical Exam: General: Critically ill  Head: ETT in place  Eyes: Anicteric  Neck: Supple,   Lungs:  Vent assisted  Heart: S1S2 paced rhythm  Abdomen:  Soft,distended  Extremities: +++ peripheral edema.  Neurologic: sedated  Skin: Warm dry  Access: RUE AVF    Basic Metabolic Panel:  Recent Labs Lab 06/06/17 1005 06/08/17 0533 06/11/17 0956 06/13/17 0559  NA 132*  132* 132* 135 135  136  K 5.4*  5.4* 5.3* 4.4 4.1  4.1  CL 96*  96* 95* 98* 101  100*  CO2 27  28 26 24 25  26   GLUCOSE 117*  116* 109* 219* 102*  100*  BUN 51*  51* 44* 55* 55*  53*  CREATININE 4.56*  4.52* 4.14* 5.57* 5.12*  5.17*  CALCIUM 9.2  9.2 8.8* 9.5 8.7*  8.7*  MG  --   --  2.5*  --   PHOS 4.6 4.3  --  1.4*    Liver Function Tests:  Recent Labs Lab 06/06/17 1005 06/08/17 0533 06/11/17 0956 06/13/17 0559  AST  --   --  26  --   ALT  --   --  18  --   ALKPHOS  --   --  182*  --   BILITOT  --   --  0.6  --   PROT  --   --  6.2*  --   ALBUMIN 3.6 3.7 3.5 3.0*   No results for input(s): LIPASE, AMYLASE in the last 168 hours.  Recent Labs Lab 06/11/17 0956  AMMONIA 91*    CBC:  Recent Labs Lab 06/06/17 1005 06/08/17 0533 06/11/17 0956 06/13/17 0559  WBC 6.7 12.3* 11.3* 7.9  HGB 9.7* 8.5* 9.4* 7.4*  HCT 33.1* 28.7* 33.0* 24.5*  MCV 103.1* 102.1* 102.8* 100.0  PLT 121* 129* 130* 83*    Cardiac Enzymes:  Recent Labs Lab 06/11/17 0956 06/11/17 1810 06/12/17 0014  TROPONINI 0.07* 0.47* 0.49*    BNP: Invalid input(s): POCBNP  CBG: No results for input(s): GLUCAP in the last 168 hours.  Microbiology: Results for orders placed or performed during the hospital  encounter of 05/26/2017  Culture, blood (routine x 2)     Status: None (Preliminary result)   Collection Time: 06/12/17  7:02 PM  Result Value Ref Range Status   Specimen Description BLOOD LEFT WRIST  Final   Special Requests IN PEDIATRIC BOTTLE Blood Culture adequate volume  Final   Culture PENDING  Incomplete   Report Status PENDING  Incomplete    Coagulation Studies:  Recent Labs  06/11/17 0956 06/12/17 0828 06/13/17 0559  LABPROT 31.6* 27.3* 27.1*  INR 2.98 2.48 2.46    Urinalysis: No results for input(s): COLORURINE, LABSPEC, PHURINE, GLUCOSEU, HGBUR, BILIRUBINUR, KETONESUR, PROTEINUR, UROBILINOGEN, NITRITE, LEUKOCYTESUR in the last 72 hours.  Invalid input(s): APPERANCEUR    Imaging: Dg Chest Port 1 View  Result Date: 06/12/2017 CLINICAL DATA:  Respiratory failure. EXAM: PORTABLE CHEST 1 VIEW COMPARISON:  Chest and abdominal radiographs yesterday. FINDINGS: Endotracheal tube 3.3 cm from the carina. Enteric tube in place, and tip below the diaphragm not included in the  field of view. Patient is post median sternotomy with prosthetic valve. Left-sided pacemaker in place. Stable cardiomegaly and pulmonary edema. Unchanged elevation of right hemidiaphragm. Hazy opacity at the lung bases likely combination of atelectasis and pleural fluid. Possible left lateral rib fractures, age-indeterminate and not well assessed on portable AP view. No pneumothorax. IMPRESSION: 1. Unchanged appearance of the chest with pulmonary edema and stable cardiomegaly. Probable small pleural effusions and bibasilar atelectasis. 2. Support apparatus unchanged with endotracheal and enteric tubes in place. Electronically Signed   By: Rubye OaksMelanie  Ehinger M.D.   On: 06/12/2017 18:29   Dg Chest Port 1 View  Result Date: 06/11/2017 CLINICAL DATA:  Endotracheal tube placement. EXAM: PORTABLE CHEST 1 VIEW COMPARISON:  05/20/2017. FINDINGS: 0955 hours. Tip of the endotracheal tube is in the mid trachea, approximately 3.8  cm above the carina. Left subclavian AICD leads appear unchanged. There is stable cardiomegaly and aortic atherosclerosis post CABG and valve replacement. There are persistent diffuse bilateral pulmonary opacities most consistent with edema. There is persistent elevation of the right hemidiaphragm with associated bibasilar atelectasis and possible small pleural effusions. No pneumothorax or consolidation. IMPRESSION: 1. Endotracheal tube is well positioned. 2. Similar appearance of the lungs with diffuse pulmonary opacities bilaterally suspicious for edema. Probable bibasilar atelectasis and small pleural effusions. Electronically Signed   By: Carey BullocksWilliam  Veazey M.D.   On: 06/11/2017 10:18   Dg Abd Portable 1v  Result Date: 06/13/2017 CLINICAL DATA:  Follow-up ileus EXAM: PORTABLE ABDOMEN - 1 VIEW COMPARISON:  06/11/2017 FINDINGS: Nasogastric tube remains within the stomach. There is less small and large bowel intestinal gas. No dilated loops. Vascular calcification again noted. Chronic curvature in degenerative change of the spine. IMPRESSION: Less intestinal gas.  No dilated loops. Electronically Signed   By: Paulina FusiMark  Shogry M.D.   On: 06/13/2017 07:47   Dg Abd Portable 1v  Result Date: 06/11/2017 CLINICAL DATA:  NG tube placement EXAM: PORTABLE ABDOMEN - 1 VIEW COMPARISON:  06/11/2017 at 1629 hours FINDINGS: The tip and side port of a gastric tube are seen in the expected location of the stomach. Moderate colonic stool burden along the ascending and transverse colon. A few scattered gas containing small bowel loops are also present similar to previous exam. The patient is status post median sternotomy with right atrial and right ventricular leads noted. Epicardial pacing wires also project over the visualized cardiac silhouette. The cardiac silhouette is enlarged. There is bibasilar atelectasis and/or scarring. Surgical clips project over the liver shadow. There is lower thoracic and lumbar degenerative disc  disease. IMPRESSION: Gastric tube in the expected location of the stomach. Electronically Signed   By: Tollie Ethavid  Kwon M.D.   On: 06/11/2017 23:59   Dg Abd Portable 1v  Result Date: 06/11/2017 CLINICAL DATA:  Attempted NG tube placement. Evaluate for possible ileus. EXAM: PORTABLE ABDOMEN - 1 VIEW COMPARISON:  None. FINDINGS: There is no evidence of OG/NG tube. Mild gaseous distention of colon is noted. Nondistended gas-filled loops of small bowel are present. No acute bony abnormalities noted. IMPRESSION: No NG or OG tube identified. Mild gaseous distention of colon which may represent a mild ileus. Electronically Signed   By: Harmon PierJeffrey  Hu M.D.   On: 06/11/2017 19:30     Medications:       Assessment/ Plan:  81 y.o. male with a PMHx of end-stage renal disease, atrial fibrillation, aortic stenosis status post St. Jude valve replacement on anticoagulation, hypertension, anemia of chronic kidney disease, secondary hyperparathyroidism, chronic hypoxic respiratory failure,  coronary artery disease status post CABG, history of mitral valve replacement who [facility].    1. ESRD on HD. With Anasarca Patient to have HD later today. Ultrafiltration as tolerated UF target 2-3 kg as tolerated  2. Hypotension.  Continue to use Midodrine as well as albumin for blood pressure support.    3.  Anemia of chronic kidney disease.  Hemoglobin 7.4 at last check. Continue to monitor.  4. Secondary hyperparathyroidism.  Last serum phosphorus was 1.4.  D/c calcium acetate.  5. Abdominal distention - N+G tube to suction       LOS: 0 David Hatfield 7/4/20189:07 AM

## 2017-06-14 LAB — CBC
HEMATOCRIT: 25 % — AB (ref 39.0–52.0)
HEMOGLOBIN: 7.4 g/dL — AB (ref 13.0–17.0)
MCH: 29.5 pg (ref 26.0–34.0)
MCHC: 29.6 g/dL — ABNORMAL LOW (ref 30.0–36.0)
MCV: 99.6 fL (ref 78.0–100.0)
Platelets: 86 10*3/uL — ABNORMAL LOW (ref 150–400)
RBC: 2.51 MIL/uL — ABNORMAL LOW (ref 4.22–5.81)
RDW: 17.2 % — ABNORMAL HIGH (ref 11.5–15.5)
WBC: 7.5 10*3/uL (ref 4.0–10.5)

## 2017-06-14 LAB — PROTIME-INR
INR: 2.39
Prothrombin Time: 26.5 seconds — ABNORMAL HIGH (ref 11.4–15.2)

## 2017-06-15 LAB — RENAL FUNCTION PANEL
ANION GAP: 8 (ref 5–15)
Albumin: 2.7 g/dL — ABNORMAL LOW (ref 3.5–5.0)
BUN: 50 mg/dL — ABNORMAL HIGH (ref 6–20)
CALCIUM: 8.4 mg/dL — AB (ref 8.9–10.3)
CHLORIDE: 99 mmol/L — AB (ref 101–111)
CO2: 26 mmol/L (ref 22–32)
Creatinine, Ser: 4.46 mg/dL — ABNORMAL HIGH (ref 0.61–1.24)
GFR calc Af Amer: 13 mL/min — ABNORMAL LOW (ref >60)
GFR calc non Af Amer: 11 mL/min — ABNORMAL LOW (ref >60)
GLUCOSE: 104 mg/dL — AB (ref 65–99)
Phosphorus: 1.9 mg/dL — ABNORMAL LOW (ref 2.5–4.6)
Potassium: 3.8 mmol/L (ref 3.5–5.1)
SODIUM: 133 mmol/L — AB (ref 135–145)

## 2017-06-15 LAB — CBC
HEMATOCRIT: 22.7 % — AB (ref 39.0–52.0)
HEMOGLOBIN: 7 g/dL — AB (ref 13.0–17.0)
MCH: 30.3 pg (ref 26.0–34.0)
MCHC: 30.8 g/dL (ref 30.0–36.0)
MCV: 98.3 fL (ref 78.0–100.0)
Platelets: 85 10*3/uL — ABNORMAL LOW (ref 150–400)
RBC: 2.31 MIL/uL — ABNORMAL LOW (ref 4.22–5.81)
RDW: 17 % — ABNORMAL HIGH (ref 11.5–15.5)
WBC: 6.3 10*3/uL (ref 4.0–10.5)

## 2017-06-15 LAB — CULTURE, RESPIRATORY

## 2017-06-15 LAB — OCCULT BLOOD X 1 CARD TO LAB, STOOL: Fecal Occult Bld: POSITIVE — AB

## 2017-06-15 LAB — CULTURE, RESPIRATORY W GRAM STAIN

## 2017-06-15 LAB — PROTIME-INR
INR: 3.11
Prothrombin Time: 32.7 seconds — ABNORMAL HIGH (ref 11.4–15.2)

## 2017-06-15 NOTE — Progress Notes (Signed)
[facility]  ROUNDING NOTE   Subjective:  Patient underwent HD earlier today  continues to remain on ventilator; Continues to have anasarca UF 1500 cc   Objective:  Vital signs in last 24 hours:  T  96.9  P 66 paced; R 18  BP 69/22;   Physical Exam: General: Critically ill  Head: ETT in place  Eyes: Anicteric  Neck: Supple,   Lungs:  Vent assisted  Heart: S1S2 paced rhythm + mechanical click  Abdomen:  Soft,distended  Extremities: +++ peripheral edema.  Neurologic: appears to be following simple commands  Skin: Warm dry  Access: RUE AVF    Basic Metabolic Panel:  Recent Labs Lab 06/11/17 0956 06/13/17 0559 06/15/17 0725  NA 135 135  136 133*  K 4.4 4.1  4.1 3.8  CL 98* 101  100* 99*  CO2 24 25  26 26   GLUCOSE 219* 102*  100* 104*  BUN 55* 55*  53* 50*  CREATININE 5.57* 5.12*  5.17* 4.46*  CALCIUM 9.5 8.7*  8.7* 8.4*  MG 2.5*  --   --   PHOS  --  1.4* 1.9*    Liver Function Tests:  Recent Labs Lab 06/11/17 0956 06/13/17 0559 06/15/17 0725  AST 26  --   --   ALT 18  --   --   ALKPHOS 182*  --   --   BILITOT 0.6  --   --   PROT 6.2*  --   --   ALBUMIN 3.5 3.0* 2.7*   No results for input(s): LIPASE, AMYLASE in the last 168 hours.  Recent Labs Lab 06/11/17 0956  AMMONIA 91*    CBC:  Recent Labs Lab 06/11/17 0956 06/13/17 0559 06/13/17 1100 06/14/17 0619 06/15/17 0725  WBC 11.3* 7.9 8.3 7.5 6.3  HGB 9.4* 7.4* 7.9* 7.4* 7.0*  HCT 33.0* 24.5* 26.1* 25.0* 22.7*  MCV 102.8* 100.0 99.2 99.6 98.3  PLT 130* 83* 81* 86* 85*    Cardiac Enzymes:  Recent Labs Lab 06/11/17 0956 06/11/17 1810 06/12/17 0014  TROPONINI 0.07* 0.47* 0.49*    BNP: Invalid input(s): POCBNP  CBG: No results for input(s): GLUCAP in the last 168 hours.  Microbiology: Results for orders placed or performed during the hospital encounter of 11-25-17  Culture, blood (routine x 2)     Status: None (Preliminary result)   Collection Time:  06/12/17  7:02 PM  Result Value Ref Range Status   Specimen Description BLOOD LEFT WRIST  Final   Special Requests IN PEDIATRIC BOTTLE Blood Culture adequate volume  Final   Culture NO GROWTH 3 DAYS  Final   Report Status PENDING  Incomplete  Culture, blood (routine x 2)     Status: None (Preliminary result)   Collection Time: 06/12/17  7:02 PM  Result Value Ref Range Status   Specimen Description BLOOD LEFT HAND  Final   Special Requests   Final    BOTTLES DRAWN AEROBIC ONLY Blood Culture adequate volume   Culture NO GROWTH 3 DAYS  Final   Report Status PENDING  Incomplete  Culture, respiratory (NON-Expectorated)     Status: None   Collection Time: 06/13/17 10:59 AM  Result Value Ref Range Status   Specimen Description TRACHEAL ASPIRATE  Final   Special Requests NONE  Final   Gram Stain   Final    RARE WBC PRESENT, PREDOMINANTLY PMN MODERATE GRAM NEGATIVE RODS    Culture ABUNDANT PSEUDOMONAS AERUGINOSA  Final   Report Status 06/15/2017 FINAL  Final   Organism ID, Bacteria PSEUDOMONAS AERUGINOSA  Final      Susceptibility   Pseudomonas aeruginosa - MIC*    CEFTAZIDIME 4 SENSITIVE Sensitive     CIPROFLOXACIN <=0.25 SENSITIVE Sensitive     GENTAMICIN <=1 SENSITIVE Sensitive     IMIPENEM 1 SENSITIVE Sensitive     PIP/TAZO 8 SENSITIVE Sensitive     CEFEPIME 2 SENSITIVE Sensitive     * ABUNDANT PSEUDOMONAS AERUGINOSA    Coagulation Studies:  Recent Labs  06/13/17 0559 06/14/17 0619 06/15/17 0730  LABPROT 27.1* 26.5* 32.7*  INR 2.46 2.39 3.11    Urinalysis: No results for input(s): COLORURINE, LABSPEC, PHURINE, GLUCOSEU, HGBUR, BILIRUBINUR, KETONESUR, PROTEINUR, UROBILINOGEN, NITRITE, LEUKOCYTESUR in the last 72 hours.  Invalid input(s): APPERANCEUR    Imaging: No results found.   Medications:       Assessment/ Plan:  81 y.o. male with a PMHx of end-stage renal disease, atrial fibrillation, aortic stenosis status post St. Jude valve replacement on  anticoagulation, hypertension, anemia of chronic kidney disease, secondary hyperparathyroidism, chronic hypoxic respiratory failure, coronary artery disease status post CABG, history of mitral valve replacement who [facility].    1. ESRD on HD. With Anasarca Patient underwent HD today. UF 1.5 L Extra Dialysis tomorrow  2. Hypotension.   Continue to use Midodrine as well as iv albumin for blood pressure support.    3.  Anemia of chronic kidney disease.  Hemoglobin 7.0 at last check. Continue to monitor.  4. Secondary hyperparathyroidism.  Last serum phosphorus was 1.9  D/c calcium acetate.  5. Abdominal distention - trickle feeds, D10 at 30 cc/hr  6. Acute Resp failure -  Vent assisted - Pseudomonas in trach aspirate - Abx as per Primary team       LOS: 0 David Hatfield 7/6/20183:22 PM

## 2017-06-16 LAB — CBC
HCT: 25.4 % — ABNORMAL LOW (ref 39.0–52.0)
Hemoglobin: 7.5 g/dL — ABNORMAL LOW (ref 13.0–17.0)
MCH: 29.5 pg (ref 26.0–34.0)
MCHC: 29.5 g/dL — ABNORMAL LOW (ref 30.0–36.0)
MCV: 100 fL (ref 78.0–100.0)
Platelets: 88 10*3/uL — ABNORMAL LOW (ref 150–400)
RBC: 2.54 MIL/uL — AB (ref 4.22–5.81)
RDW: 17 % — AB (ref 11.5–15.5)
WBC: 6.3 10*3/uL (ref 4.0–10.5)

## 2017-06-16 LAB — PROTIME-INR
INR: 2.18
Prothrombin Time: 24.6 seconds — ABNORMAL HIGH (ref 11.4–15.2)

## 2017-06-17 LAB — CULTURE, BLOOD (ROUTINE X 2)
CULTURE: NO GROWTH
Culture: NO GROWTH
SPECIAL REQUESTS: ADEQUATE
Special Requests: ADEQUATE

## 2017-06-17 LAB — PROTIME-INR
INR: 1.73
Prothrombin Time: 20.4 seconds — ABNORMAL HIGH (ref 11.4–15.2)

## 2017-06-18 LAB — BASIC METABOLIC PANEL
Anion gap: 10 (ref 5–15)
BUN: 52 mg/dL — ABNORMAL HIGH (ref 6–20)
CO2: 26 mmol/L (ref 22–32)
CREATININE: 4.09 mg/dL — AB (ref 0.61–1.24)
Calcium: 8.7 mg/dL — ABNORMAL LOW (ref 8.9–10.3)
Chloride: 99 mmol/L — ABNORMAL LOW (ref 101–111)
GFR calc non Af Amer: 13 mL/min — ABNORMAL LOW (ref >60)
GFR, EST AFRICAN AMERICAN: 15 mL/min — AB (ref >60)
Glucose, Bld: 116 mg/dL — ABNORMAL HIGH (ref 65–99)
Potassium: 3.8 mmol/L (ref 3.5–5.1)
SODIUM: 135 mmol/L (ref 135–145)

## 2017-06-18 LAB — RENAL FUNCTION PANEL
Albumin: 3 g/dL — ABNORMAL LOW (ref 3.5–5.0)
Anion gap: 8 (ref 5–15)
BUN: 52 mg/dL — ABNORMAL HIGH (ref 6–20)
CALCIUM: 8.7 mg/dL — AB (ref 8.9–10.3)
CO2: 27 mmol/L (ref 22–32)
CREATININE: 4.04 mg/dL — AB (ref 0.61–1.24)
Chloride: 100 mmol/L — ABNORMAL LOW (ref 101–111)
GFR calc non Af Amer: 13 mL/min — ABNORMAL LOW (ref >60)
GFR, EST AFRICAN AMERICAN: 15 mL/min — AB (ref >60)
GLUCOSE: 114 mg/dL — AB (ref 65–99)
Phosphorus: 2.2 mg/dL — ABNORMAL LOW (ref 2.5–4.6)
Potassium: 3.8 mmol/L (ref 3.5–5.1)
SODIUM: 135 mmol/L (ref 135–145)

## 2017-06-18 LAB — CBC
HCT: 25.9 % — ABNORMAL LOW (ref 39.0–52.0)
Hemoglobin: 7.8 g/dL — ABNORMAL LOW (ref 13.0–17.0)
MCH: 29.9 pg (ref 26.0–34.0)
MCHC: 30.1 g/dL (ref 30.0–36.0)
MCV: 99.2 fL (ref 78.0–100.0)
PLATELETS: 88 10*3/uL — AB (ref 150–400)
RBC: 2.61 MIL/uL — AB (ref 4.22–5.81)
RDW: 16.6 % — ABNORMAL HIGH (ref 11.5–15.5)
WBC: 9 10*3/uL (ref 4.0–10.5)

## 2017-06-18 LAB — PROTIME-INR
INR: 1.7
Prothrombin Time: 20.2 seconds — ABNORMAL HIGH (ref 11.4–15.2)

## 2017-06-18 NOTE — Progress Notes (Signed)
[facility]  ROUNDING NOTE   Subjective:  Patient seen and evaluated during hemodialysis. Remains on the ventilator. Ultrafiltration target 1.5 kg today.   Objective:  Vital signs in last 24 hours:  Temperature 97.1 pulse 47 respirations 16 blood pressure 99/18 Physical Exam: General: Critically ill  Head: ETT in place  Eyes: Anicteric  Neck: Supple  Lungs:  Bilateral rhonchi,Vent assisted  Heart: S1S2 paced rhythm + mechanical click  Abdomen:  Soft,distended  Extremities: ++ peripheral edema.  Neurologic: Awake, alert, follows simple commands  Skin: Warm dry  Access: RUE AVF    Basic Metabolic Panel:  Recent Labs Lab 06/13/17 0559 06/15/17 0725 06/18/17 0620  NA 135  136 133* 135  135  K 4.1  4.1 3.8 3.8  3.8  CL 101  100* 99* 100*  99*  CO2 25  26 26 27  26   GLUCOSE 102*  100* 104* 114*  116*  BUN 55*  53* 50* 52*  52*  CREATININE 5.12*  5.17* 4.46* 4.04*  4.09*  CALCIUM 8.7*  8.7* 8.4* 8.7*  8.7*  PHOS 1.4* 1.9* 2.2*    Liver Function Tests:  Recent Labs Lab 06/13/17 0559 06/15/17 0725 06/18/17 0620  ALBUMIN 3.0* 2.7* 3.0*   No results for input(s): LIPASE, AMYLASE in the last 168 hours. No results for input(s): AMMONIA in the last 168 hours.  CBC:  Recent Labs Lab 06/13/17 1100 06/14/17 0619 06/15/17 0725 06/16/17 1056 06/18/17 0620  WBC 8.3 7.5 6.3 6.3 9.0  HGB 7.9* 7.4* 7.0* 7.5* 7.8*  HCT 26.1* 25.0* 22.7* 25.4* 25.9*  MCV 99.2 99.6 98.3 100.0 99.2  PLT 81* 86* 85* 88* 88*    Cardiac Enzymes:  Recent Labs Lab 06/11/17 1810 06/12/17 0014  TROPONINI 0.47* 0.49*    BNP: Invalid input(s): POCBNP  CBG: No results for input(s): GLUCAP in the last 168 hours.  Microbiology: Results for orders placed or performed during the hospital encounter of Aug 21, 2017  Culture, blood (routine x 2)     Status: None   Collection Time: 06/12/17  7:02 PM  Result Value Ref Range Status   Specimen Description BLOOD  LEFT WRIST  Final   Special Requests IN PEDIATRIC BOTTLE Blood Culture adequate volume  Final   Culture NO GROWTH 5 DAYS  Final   Report Status 06/17/2017 FINAL  Final  Culture, blood (routine x 2)     Status: None   Collection Time: 06/12/17  7:02 PM  Result Value Ref Range Status   Specimen Description BLOOD LEFT HAND  Final   Special Requests   Final    BOTTLES DRAWN AEROBIC ONLY Blood Culture adequate volume   Culture NO GROWTH 5 DAYS  Final   Report Status 06/17/2017 FINAL  Final  Culture, respiratory (NON-Expectorated)     Status: None   Collection Time: 06/13/17 10:59 AM  Result Value Ref Range Status   Specimen Description TRACHEAL ASPIRATE  Final   Special Requests NONE  Final   Gram Stain   Final    RARE WBC PRESENT, PREDOMINANTLY PMN MODERATE GRAM NEGATIVE RODS    Culture ABUNDANT PSEUDOMONAS AERUGINOSA  Final   Report Status 06/15/2017 FINAL  Final   Organism ID, Bacteria PSEUDOMONAS AERUGINOSA  Final      Susceptibility   Pseudomonas aeruginosa - MIC*    CEFTAZIDIME 4 SENSITIVE Sensitive     CIPROFLOXACIN <=0.25 SENSITIVE Sensitive     GENTAMICIN <=1 SENSITIVE Sensitive     IMIPENEM 1 SENSITIVE Sensitive  PIP/TAZO 8 SENSITIVE Sensitive     CEFEPIME 2 SENSITIVE Sensitive     * ABUNDANT PSEUDOMONAS AERUGINOSA    Coagulation Studies:  Recent Labs  06/16/17 0610 06/17/17 0733 06/18/17 0620  LABPROT 24.6* 20.4* 20.2*  INR 2.18 1.73 1.70    Urinalysis: No results for input(s): COLORURINE, LABSPEC, PHURINE, GLUCOSEU, HGBUR, BILIRUBINUR, KETONESUR, PROTEINUR, UROBILINOGEN, NITRITE, LEUKOCYTESUR in the last 72 hours.  Invalid input(s): APPERANCEUR    Imaging: No results found.   Medications:       Assessment/ Plan:  81 y.o. male with a PMHx of end-stage renal disease, atrial fibrillation, aortic stenosis status post St. Jude valve replacement on anticoagulation, hypertension, anemia of chronic kidney disease, secondary hyperparathyroidism, chronic  hypoxic respiratory failure, coronary artery disease status post CABG, history of mitral valve replacement who [facility].    1. ESRD on HD. With Anasarca Patient seen and evaluated during hemodialysis. Ultrafiltration target is 1.5 kg. We will plan for dialysis again on Wednesday.  2. Hypotension.   Patient continues to have hypotension. We will continue to use albumin during dialysis.   3.  Anemia of chronic kidney disease.  Hemoglobin currently up to 7.8. Consider transfusion for hemoglobin of 7 or less.  4. Secondary hyperparathyroidism.  Phosphorus up to 2.2 after discontinuation of binders.  5. Abdominal distention - management per hospitalist.  6. Acute Resp failure -  Continue ventilatory support as well as antibiotics as per primary team.       LOS: 0 Avenell Sellers 7/9/20183:32 PM

## 2017-06-19 ENCOUNTER — Other Ambulatory Visit (HOSPITAL_COMMUNITY): Payer: Self-pay

## 2017-06-19 LAB — PROTIME-INR
INR: 1.67
PROTHROMBIN TIME: 19.9 s — AB (ref 11.4–15.2)

## 2017-06-20 ENCOUNTER — Other Ambulatory Visit (HOSPITAL_COMMUNITY): Payer: Self-pay

## 2017-06-20 LAB — RENAL FUNCTION PANEL
ALBUMIN: 3.4 g/dL — AB (ref 3.5–5.0)
Albumin: 3.2 g/dL — ABNORMAL LOW (ref 3.5–5.0)
Anion gap: 10 (ref 5–15)
Anion gap: 10 (ref 5–15)
BUN: 44 mg/dL — AB (ref 6–20)
BUN: 27 mg/dL — AB (ref 6–20)
CHLORIDE: 96 mmol/L — AB (ref 101–111)
CHLORIDE: 95 mmol/L — AB (ref 101–111)
CO2: 26 mmol/L (ref 22–32)
CO2: 28 mmol/L (ref 22–32)
CREATININE: 2.63 mg/dL — AB (ref 0.61–1.24)
Calcium: 8.7 mg/dL — ABNORMAL LOW (ref 8.9–10.3)
Calcium: 8.4 mg/dL — ABNORMAL LOW (ref 8.9–10.3)
Creatinine, Ser: 3.92 mg/dL — ABNORMAL HIGH (ref 0.61–1.24)
GFR calc Af Amer: 16 mL/min — ABNORMAL LOW (ref >60)
GFR calc Af Amer: 26 mL/min — ABNORMAL LOW (ref >60)
GFR calc non Af Amer: 13 mL/min — ABNORMAL LOW (ref >60)
GFR, EST NON AFRICAN AMERICAN: 22 mL/min — AB (ref >60)
GLUCOSE: 174 mg/dL — AB (ref 65–99)
GLUCOSE: 157 mg/dL — AB (ref 65–99)
POTASSIUM: 5.4 mmol/L — AB (ref 3.5–5.1)
POTASSIUM: 4 mmol/L (ref 3.5–5.1)
Phosphorus: 6.5 mg/dL — ABNORMAL HIGH (ref 2.5–4.6)
Phosphorus: 3.4 mg/dL (ref 2.5–4.6)
Sodium: 132 mmol/L — ABNORMAL LOW (ref 135–145)
Sodium: 133 mmol/L — ABNORMAL LOW (ref 135–145)

## 2017-06-20 LAB — BLOOD GAS, ARTERIAL
ACID-BASE DEFICIT: 0.2 mmol/L (ref 0.0–2.0)
ACID-BASE EXCESS: 2 mmol/L (ref 0.0–2.0)
BICARBONATE: 30.6 mmol/L — AB (ref 20.0–28.0)
Bicarbonate: 28.2 mmol/L — ABNORMAL HIGH (ref 20.0–28.0)
DELIVERY SYSTEMS: POSITIVE
DRAWN BY: 27022
Expiratory PAP: 5
FIO2: 0.5
INSPIRATORY PAP: 20
O2 Content: 2 L/min
O2 SAT: 72.3 %
O2 Saturation: 95.6 %
PCO2 ART: 62.8 mmHg — AB (ref 32.0–48.0)
PH ART: 6.997 — AB (ref 7.350–7.450)
PH ART: 7.274 — AB (ref 7.350–7.450)
PO2 ART: 34.4 mmHg — AB (ref 83.0–108.0)
Patient temperature: 98.6
Patient temperature: 98.6
pO2, Arterial: 92.7 mmHg (ref 83.0–108.0)

## 2017-06-20 LAB — CBC
HEMATOCRIT: 33.8 % — AB (ref 39.0–52.0)
HEMATOCRIT: 52.8 % — AB (ref 39.0–52.0)
HEMOGLOBIN: 9.8 g/dL — AB (ref 13.0–17.0)
Hemoglobin: 15.6 g/dL (ref 13.0–17.0)
MCH: 29.9 pg (ref 26.0–34.0)
MCH: 30.5 pg (ref 26.0–34.0)
MCHC: 29 g/dL — AB (ref 30.0–36.0)
MCHC: 29.5 g/dL — AB (ref 30.0–36.0)
MCV: 103 fL — AB (ref 78.0–100.0)
MCV: 103.3 fL — AB (ref 78.0–100.0)
PLATELETS: 64 10*3/uL — AB (ref 150–400)
Platelets: 231 10*3/uL (ref 150–400)
RBC: 3.28 MIL/uL — ABNORMAL LOW (ref 4.22–5.81)
RBC: 5.11 MIL/uL (ref 4.22–5.81)
RDW: 16.9 % — AB (ref 11.5–15.5)
RDW: 16.8 % — AB (ref 11.5–15.5)
WBC: 19.7 10*3/uL — ABNORMAL HIGH (ref 4.0–10.5)
WBC: 7.7 10*3/uL (ref 4.0–10.5)

## 2017-06-20 LAB — PROTIME-INR
INR: 1.51
Prothrombin Time: 18.4 seconds — ABNORMAL HIGH (ref 11.4–15.2)

## 2017-06-20 LAB — TROPONIN I
Troponin I: 0.05 ng/mL (ref <0.03)
Troponin I: 0.05 ng/mL (ref <0.03)

## 2017-06-20 LAB — MAGNESIUM: Magnesium: 1.9 mg/dL (ref 1.7–2.4)

## 2017-06-20 NOTE — Progress Notes (Signed)
[facility]  ROUNDING NOTE   Subjective:  Patient seen post hemodialysis. There appears to be some discrepancy between measured blood pressures. Blood pressure measured by floor nurse was 120/47. Systolic blood pressure was 55 as checked by dialysis nurse.  Objective:  Vital signs in last 24 hours:  Temperature 91.5 pulse 74 respirations 16 blood pressure at the end of dialysis was 55/17, blood pressure measured by floor nurse was 120/47  Physical Exam: General: Critically ill appearing  Head: On bipap  Eyes: Anicteric  Neck: Supple  Lungs:  Bilateral rhonchi, currently on bipap  Heart: S1S2 paced rhythm + mechanical click  Abdomen:  Soft, distended  Extremities: ++ peripheral edema.  Neurologic: Awake, alert, follows simple commands  Skin: Warm dry  Access: RUE AVF    Basic Metabolic Panel:  Recent Labs Lab 06/15/17 0725 06/18/17 0620 06/20/17 0439 06/20/17 1154 06/20/17 1325  NA 133* 135  135 132* 133*  --   K 3.8 3.8  3.8 5.4* 4.0  --   CL 99* 100*  99* 96* 95*  --   CO2 26 27  26 26 28   --   GLUCOSE 104* 114*  116* 174* 157*  --   BUN 50* 52*  52* 44* 27*  --   CREATININE 4.46* 4.04*  4.09* 3.92* 2.63*  --   CALCIUM 8.4* 8.7*  8.7* 8.7* 8.4*  --   MG  --   --   --   --  1.9  PHOS 1.9* 2.2* 6.5* 3.4  --     Liver Function Tests:  Recent Labs Lab 06/15/17 0725 06/18/17 0620 06/20/17 0439 06/20/17 1154  ALBUMIN 2.7* 3.0* 3.4* 3.2*   No results for input(s): LIPASE, AMYLASE in the last 168 hours. No results for input(s): AMMONIA in the last 168 hours.  CBC:  Recent Labs Lab 06/15/17 0725 06/16/17 1056 06/18/17 0620 06/20/17 0439 06/20/17 1325  WBC 6.3 6.3 9.0 19.7* 7.7  HGB 7.0* 7.5* 7.8* 9.8* 15.6  HCT 22.7* 25.4* 25.9* 33.8* 52.8*  MCV 98.3 100.0 99.2 103.0* 103.3*  PLT 85* 88* 88* 231 64*    Cardiac Enzymes:  Recent Labs Lab 06/20/17 1325  TROPONINI 0.05*    BNP: Invalid input(s): POCBNP  CBG: No results  for input(s): GLUCAP in the last 168 hours.  Microbiology: Results for orders placed or performed during the hospital encounter of 12-23-16  Culture, blood (routine x 2)     Status: None   Collection Time: 06/12/17  7:02 PM  Result Value Ref Range Status   Specimen Description BLOOD LEFT WRIST  Final   Special Requests IN PEDIATRIC BOTTLE Blood Culture adequate volume  Final   Culture NO GROWTH 5 DAYS  Final   Report Status 06/17/2017 FINAL  Final  Culture, blood (routine x 2)     Status: None   Collection Time: 06/12/17  7:02 PM  Result Value Ref Range Status   Specimen Description BLOOD LEFT HAND  Final   Special Requests   Final    BOTTLES DRAWN AEROBIC ONLY Blood Culture adequate volume   Culture NO GROWTH 5 DAYS  Final   Report Status 06/17/2017 FINAL  Final  Culture, respiratory (NON-Expectorated)     Status: None   Collection Time: 06/13/17 10:59 AM  Result Value Ref Range Status   Specimen Description TRACHEAL ASPIRATE  Final   Special Requests NONE  Final   Gram Stain   Final    RARE WBC PRESENT, PREDOMINANTLY PMN MODERATE  GRAM NEGATIVE RODS    Culture ABUNDANT PSEUDOMONAS AERUGINOSA  Final   Report Status 06/15/2017 FINAL  Final   Organism ID, Bacteria PSEUDOMONAS AERUGINOSA  Final      Susceptibility   Pseudomonas aeruginosa - MIC*    CEFTAZIDIME 4 SENSITIVE Sensitive     CIPROFLOXACIN <=0.25 SENSITIVE Sensitive     GENTAMICIN <=1 SENSITIVE Sensitive     IMIPENEM 1 SENSITIVE Sensitive     PIP/TAZO 8 SENSITIVE Sensitive     CEFEPIME 2 SENSITIVE Sensitive     * ABUNDANT PSEUDOMONAS AERUGINOSA    Coagulation Studies:  Recent Labs  06/18/17 0620 06/19/17 0638 06/20/17 0439  LABPROT 20.2* 19.9* 18.4*  INR 1.70 1.67 1.51    Urinalysis: No results for input(s): COLORURINE, LABSPEC, PHURINE, GLUCOSEU, HGBUR, BILIRUBINUR, KETONESUR, PROTEINUR, UROBILINOGEN, NITRITE, LEUKOCYTESUR in the last 72 hours.  Invalid input(s): APPERANCEUR    Imaging: Dg Chest  Port 1 View  Result Date: 06/20/2017 CLINICAL DATA:  Respiratory failure EXAM: PORTABLE CHEST 1 VIEW COMPARISON:  06/12/2017 chest radiograph. FINDINGS: Enteric tube enters stomach with the tip not seen on this image. Stable configuration of 2 lead left subclavian ICD and cardiac valvular prosthesis. Intact sternotomy wires. Stable cardiomediastinal silhouette with mild cardiomegaly and aortic atherosclerosis. No pneumothorax. Stable small bilateral pleural effusions. Mild pulmonary edema. Mild-to-moderate bibasilar atelectasis. Low lung volumes. IMPRESSION: 1. Mild congestive heart failure. 2. Small bilateral pleural effusions. 3. Low lung volumes with bibasilar atelectasis. Electronically Signed   By: Delbert Phenix M.D.   On: 06/20/2017 13:59   Dg Abd Portable 1v  Result Date: 06/19/2017 CLINICAL DATA:  Encounter for nasogastric (NG) tube placement. Another image was taken because the tube wasn't recognized in the first one. EXAM: PORTABLE ABDOMEN - 1 VIEW COMPARISON:  06/13/2017 FINDINGS: Nasogastric tube has been advanced to the pylorus. Normal bowel gas pattern. Surgical clips in the left upper abdomen. Previous CABG and valve surgery. AICD leads partially visualized. IMPRESSION: 1. Nasogastric tube to the pylorus. Electronically Signed   By: Corlis Leak M.D.   On: 06/19/2017 16:55     Medications:       Assessment/ Plan:  81 y.o. male with a PMHx of end-stage renal disease, atrial fibrillation, aortic stenosis status post [employer] valve replacement on anticoagulation, hypertension, anemia of chronic kidney disease, secondary hyperparathyroidism, chronic hypoxic respiratory failure, coronary artery disease status post CABG, history of mitral valve replacement [facility] debility.    1. ESRD on HD. With Anasarca Patient completed hemodialysis today. We will plan for dialysis again on Friday.  2. Hypotension.   There is some  blood pressure discrepancy noted. Blood pressure at the end of dialysis was 55/17. According to the floor nurse blood pressure was 120/47. We may need to check blood pressures in the upper extremities.  Continue to use albumin with HD.    3.  Anemia of chronic kidney disease.  Hemoglobin today was reported as 15.6 however prior to this hemoglobin was 9.8. Suspect that hemoglobin today is erroneous.  4. Secondary hyperparathyroidism.  Phosphorus 3.4 today and acceptable. Continue to monitor.  5. Abdominal distention - management per hospitalist.  6. Acute Resp failure -  Patient currently weaned off the ventilator and is on BiPAP. Unclear as to whether he will be able to stay off of the ventilator.       LOS: 0 David Hatfield 7/11/20183:09 PM

## 2017-06-21 LAB — PROTIME-INR
INR: 2.39
Prothrombin Time: 26.5 seconds — ABNORMAL HIGH (ref 11.4–15.2)

## 2017-06-21 LAB — TROPONIN I: TROPONIN I: 0.05 ng/mL — AB (ref <0.03)

## 2017-06-22 LAB — BLOOD CULTURE ID PANEL (REFLEXED)
Acinetobacter baumannii: NOT DETECTED
CANDIDA ALBICANS: NOT DETECTED
CANDIDA TROPICALIS: NOT DETECTED
Candida glabrata: NOT DETECTED
Candida krusei: NOT DETECTED
Candida parapsilosis: NOT DETECTED
ENTEROBACTERIACEAE SPECIES: NOT DETECTED
ENTEROCOCCUS SPECIES: NOT DETECTED
Enterobacter cloacae complex: NOT DETECTED
Escherichia coli: NOT DETECTED
HAEMOPHILUS INFLUENZAE: NOT DETECTED
KLEBSIELLA PNEUMONIAE: NOT DETECTED
Klebsiella oxytoca: NOT DETECTED
LISTERIA MONOCYTOGENES: NOT DETECTED
METHICILLIN RESISTANCE: DETECTED — AB
NEISSERIA MENINGITIDIS: NOT DETECTED
Proteus species: NOT DETECTED
Pseudomonas aeruginosa: NOT DETECTED
STAPHYLOCOCCUS SPECIES: DETECTED — AB
STREPTOCOCCUS PYOGENES: NOT DETECTED
STREPTOCOCCUS SPECIES: NOT DETECTED
Serratia marcescens: NOT DETECTED
Staphylococcus aureus (BCID): NOT DETECTED
Streptococcus agalactiae: NOT DETECTED
Streptococcus pneumoniae: NOT DETECTED

## 2017-06-23 LAB — CULTURE, RESPIRATORY

## 2017-06-24 LAB — CULTURE, BLOOD (ROUTINE X 2): SPECIAL REQUESTS: ADEQUATE

## 2017-06-25 LAB — CULTURE, BLOOD (ROUTINE X 2)
Culture: NO GROWTH
Special Requests: ADEQUATE

## 2017-07-11 DEATH — deceased

## 2019-01-06 IMAGING — DX DG ABD PORTABLE 1V
2 series · 2 of 2 positions shown · non-contrast
Comparison: 06/13/2017

CLINICAL DATA: Encounter for nasogastric (NG) tube placement.
Another image was taken because the tube wasn't recognized in the
first one.

EXAM:
PORTABLE ABDOMEN - 1 VIEW

[abdomen kub (1 of 2)]
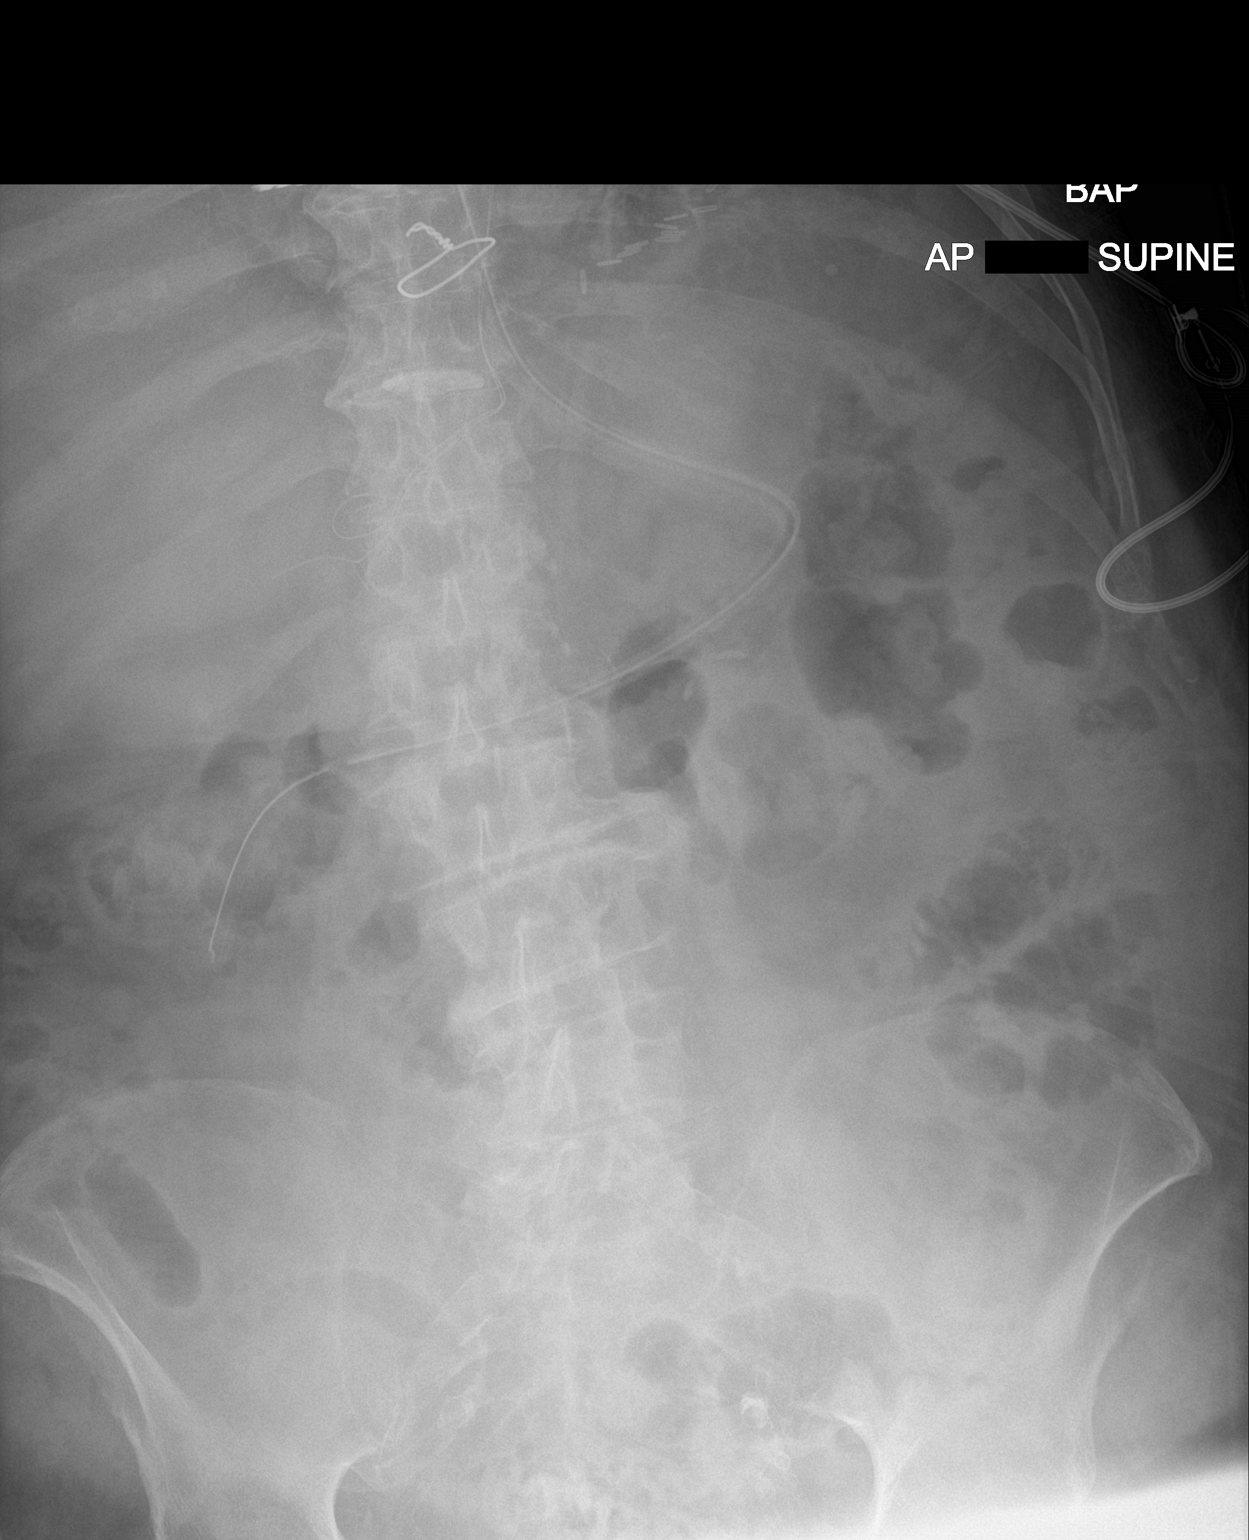

[abdomen kub (2 of 2)]
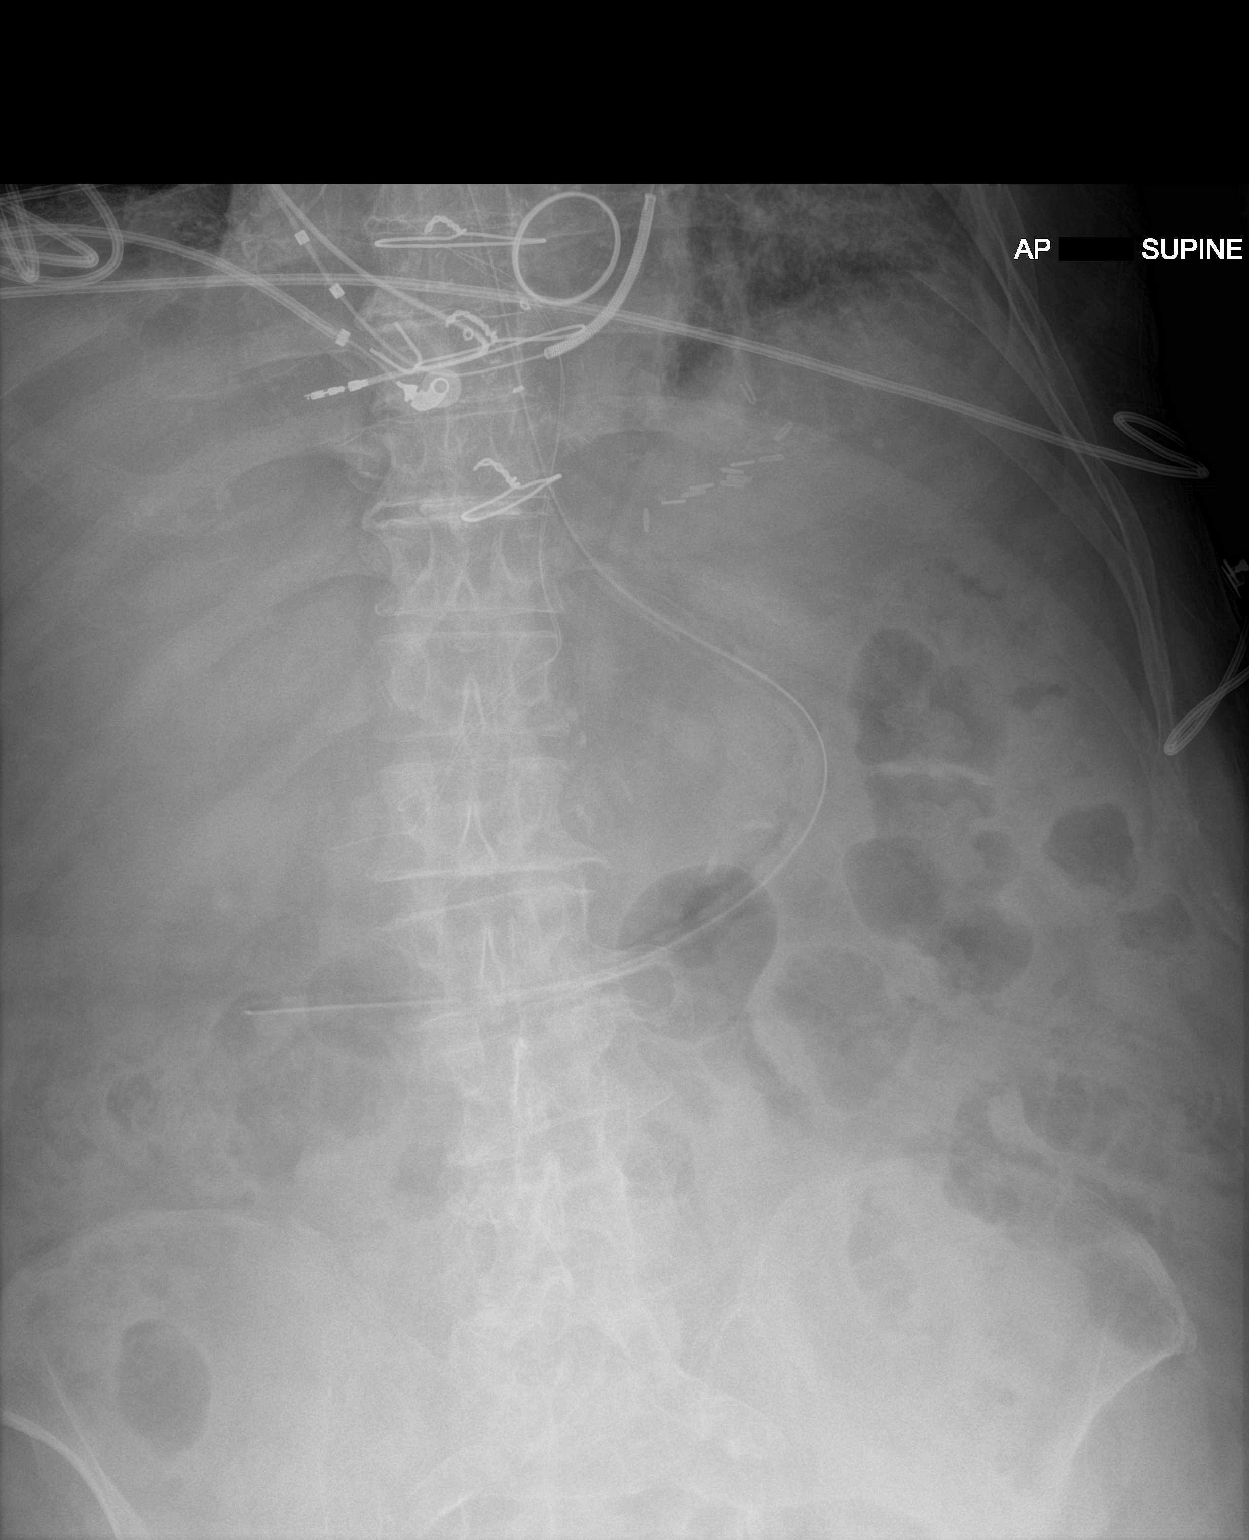

[2 of 2 positions shown; findings below may reference images not displayed]

FINDINGS: Nasogastric tube has been advanced to the pylorus. Normal bowel gas
pattern. Surgical clips in the left upper abdomen. Previous CABG and
valve surgery. AICD leads partially visualized.
IMPRESSION: 1. Nasogastric tube to the pylorus.
